# Patient Record
Sex: Female | Born: 1985 | Race: Black or African American | Hispanic: No | Marital: Single | Smoking: Current every day smoker
Health system: Southern US, Community
[De-identification: ages and names within clinical notes are randomized; demographics above are authoritative.]

## PROBLEM LIST (undated history)

## (undated) ENCOUNTER — Inpatient Hospital Stay: Admission: EM | Payer: Self-pay | Source: Home / Self Care

## (undated) DIAGNOSIS — D649 Anemia, unspecified: Secondary | ICD-10-CM

## (undated) DIAGNOSIS — F419 Anxiety disorder, unspecified: Secondary | ICD-10-CM

## (undated) DIAGNOSIS — E039 Hypothyroidism, unspecified: Secondary | ICD-10-CM

## (undated) DIAGNOSIS — J45909 Unspecified asthma, uncomplicated: Secondary | ICD-10-CM

## (undated) HISTORY — DX: Anemia, unspecified: D64.9

## (undated) HISTORY — DX: Unspecified asthma, uncomplicated: J45.909

## (undated) HISTORY — DX: Hypothyroidism, unspecified: E03.9

## (undated) HISTORY — DX: Anxiety disorder, unspecified: F41.9

## (undated) HISTORY — PX: TUBAL LIGATION: SHX77

---
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
## 2006-08-23 ENCOUNTER — Ambulatory Visit (HOSPITAL_COMMUNITY): Admission: RE | Admit: 2006-08-23 | Discharge: 2006-08-23 | Payer: Self-pay | Admitting: Obstetrics & Gynecology

## 2006-10-18 ENCOUNTER — Inpatient Hospital Stay (HOSPITAL_COMMUNITY): Admission: AD | Admit: 2006-10-18 | Discharge: 2006-10-19 | Payer: Self-pay | Admitting: Obstetrics & Gynecology

## 2006-10-18 ENCOUNTER — Ambulatory Visit: Payer: Self-pay | Admitting: Family Medicine

## 2007-08-15 ENCOUNTER — Emergency Department (HOSPITAL_COMMUNITY): Admission: EM | Admit: 2007-08-15 | Discharge: 2007-08-15 | Payer: Self-pay | Admitting: Emergency Medicine

## 2007-09-03 ENCOUNTER — Inpatient Hospital Stay (HOSPITAL_COMMUNITY): Admission: AD | Admit: 2007-09-03 | Discharge: 2007-09-04 | Payer: Self-pay | Admitting: Obstetrics and Gynecology

## 2008-01-11 ENCOUNTER — Ambulatory Visit: Payer: Self-pay | Admitting: Obstetrics and Gynecology

## 2008-01-11 ENCOUNTER — Inpatient Hospital Stay (HOSPITAL_COMMUNITY): Admission: AD | Admit: 2008-01-11 | Discharge: 2008-01-11 | Payer: Self-pay | Admitting: Obstetrics & Gynecology

## 2008-01-17 ENCOUNTER — Inpatient Hospital Stay (HOSPITAL_COMMUNITY): Admission: AD | Admit: 2008-01-17 | Discharge: 2008-01-18 | Payer: Self-pay | Admitting: Obstetrics and Gynecology

## 2008-01-17 ENCOUNTER — Ambulatory Visit: Payer: Self-pay | Admitting: Obstetrics and Gynecology

## 2008-10-24 ENCOUNTER — Inpatient Hospital Stay (HOSPITAL_COMMUNITY): Admission: AD | Admit: 2008-10-24 | Discharge: 2008-10-24 | Payer: Self-pay | Admitting: Obstetrics & Gynecology

## 2009-02-06 ENCOUNTER — Emergency Department (HOSPITAL_COMMUNITY): Admission: EM | Admit: 2009-02-06 | Discharge: 2009-02-06 | Payer: Self-pay | Admitting: Emergency Medicine

## 2009-08-14 ENCOUNTER — Inpatient Hospital Stay (HOSPITAL_COMMUNITY): Admission: AD | Admit: 2009-08-14 | Discharge: 2009-08-15 | Payer: Self-pay | Admitting: Obstetrics & Gynecology

## 2009-12-15 ENCOUNTER — Ambulatory Visit: Payer: Self-pay | Admitting: Family Medicine

## 2009-12-15 ENCOUNTER — Inpatient Hospital Stay (HOSPITAL_COMMUNITY): Admission: AD | Admit: 2009-12-15 | Discharge: 2009-12-17 | Payer: Self-pay | Admitting: Obstetrics & Gynecology

## 2010-07-15 LAB — CBC
HCT: 30.7 % — ABNORMAL LOW (ref 36.0–46.0)
HCT: 33.5 % — ABNORMAL LOW (ref 36.0–46.0)
Hemoglobin: 10.9 g/dL — ABNORMAL LOW (ref 12.0–15.0)
MCHC: 34.8 g/dL (ref 30.0–36.0)
MCV: 96.7 fL (ref 78.0–100.0)
Platelets: 354 10*3/uL (ref 150–400)
RBC: 3.18 MIL/uL — ABNORMAL LOW (ref 3.87–5.11)
RDW: 13.6 % (ref 11.5–15.5)

## 2010-07-15 LAB — RAPID URINE DRUG SCREEN, HOSP PERFORMED
Amphetamines: NOT DETECTED
Tetrahydrocannabinol: NOT DETECTED

## 2010-07-20 LAB — URINALYSIS, ROUTINE W REFLEX MICROSCOPIC
Glucose, UA: NEGATIVE mg/dL
Leukocytes, UA: NEGATIVE
Protein, ur: NEGATIVE mg/dL
Specific Gravity, Urine: 1.025 (ref 1.005–1.030)
pH: 7 (ref 5.0–8.0)

## 2010-07-20 LAB — CBC
HCT: 29.8 % — ABNORMAL LOW (ref 36.0–46.0)
Platelets: 327 10*3/uL (ref 150–400)
RDW: 13.3 % (ref 11.5–15.5)

## 2010-07-20 LAB — WET PREP, GENITAL: Yeast Wet Prep HPF POC: NONE SEEN

## 2010-07-20 LAB — URINE MICROSCOPIC-ADD ON

## 2010-07-20 LAB — POCT PREGNANCY, URINE: Preg Test, Ur: POSITIVE

## 2010-08-09 LAB — WET PREP, GENITAL

## 2010-08-09 LAB — URINALYSIS, ROUTINE W REFLEX MICROSCOPIC
Glucose, UA: NEGATIVE mg/dL
Ketones, ur: NEGATIVE mg/dL
Leukocytes, UA: NEGATIVE
pH: 5.5 (ref 5.0–8.0)

## 2010-08-09 LAB — GC/CHLAMYDIA PROBE AMP, GENITAL: Chlamydia, DNA Probe: NEGATIVE

## 2010-08-09 LAB — URINE MICROSCOPIC-ADD ON

## 2010-08-16 ENCOUNTER — Inpatient Hospital Stay (INDEPENDENT_AMBULATORY_CARE_PROVIDER_SITE_OTHER)
Admission: RE | Admit: 2010-08-16 | Discharge: 2010-08-16 | Disposition: A | Discharge status: Home / Self Care | Payer: Self-pay | Source: Ambulatory Visit | Attending: Family Medicine | Admitting: Family Medicine

## 2010-08-16 DIAGNOSIS — N912 Amenorrhea, unspecified: Secondary | ICD-10-CM

## 2010-08-16 LAB — POCT PREGNANCY, URINE: Preg Test, Ur: NEGATIVE

## 2011-01-25 LAB — POCT I-STAT, CHEM 8
Calcium, Ion: 1.27
HCT: 35 — ABNORMAL LOW
TCO2: 20

## 2011-01-25 LAB — D-DIMER, QUANTITATIVE: D-Dimer, Quant: 0.52 — ABNORMAL HIGH

## 2011-01-25 LAB — WET PREP, GENITAL: Yeast Wet Prep HPF POC: NONE SEEN

## 2011-01-25 LAB — POCT PREGNANCY, URINE: Operator id: 146091

## 2011-01-31 LAB — CBC
Hemoglobin: 9.9 — ABNORMAL LOW
MCHC: 34.3
MCV: 95.7
RBC: 3.03 — ABNORMAL LOW

## 2011-02-16 LAB — CBC
HCT: 33.3 — ABNORMAL LOW
MCV: 93.3
Platelets: 383
RDW: 14
WBC: 10.1

## 2011-02-16 LAB — RPR: RPR Ser Ql: NONREACTIVE

## 2013-03-15 ENCOUNTER — Encounter (HOSPITAL_COMMUNITY): Payer: Self-pay | Admitting: Emergency Medicine

## 2013-03-15 ENCOUNTER — Emergency Department (HOSPITAL_COMMUNITY)
Admission: EM | Admit: 2013-03-15 | Discharge: 2013-03-15 | Disposition: A | Discharge status: Home / Self Care | Payer: Self-pay | Attending: Emergency Medicine | Admitting: Emergency Medicine

## 2013-03-15 DIAGNOSIS — K029 Dental caries, unspecified: Secondary | ICD-10-CM | POA: Insufficient documentation

## 2013-03-15 DIAGNOSIS — F17200 Nicotine dependence, unspecified, uncomplicated: Secondary | ICD-10-CM | POA: Insufficient documentation

## 2013-03-15 DIAGNOSIS — K089 Disorder of teeth and supporting structures, unspecified: Secondary | ICD-10-CM | POA: Insufficient documentation

## 2013-03-15 DIAGNOSIS — K0889 Other specified disorders of teeth and supporting structures: Secondary | ICD-10-CM

## 2013-03-15 MED ORDER — HYDROCODONE-ACETAMINOPHEN 5-325 MG PO TABS: 1.0000 | ORAL_TABLET | ORAL | Status: DC | PRN

## 2013-03-15 NOTE — ED Provider Notes (Signed)
CSN: [id number]     Arrival date & time 03/15/13  1402 History  This chart was scribed for non-physician practitioner, Oletha Blend, working with Gerhard Munch, MD by Shari Heritage, ED Scribe. This patient was seen in room TR05C/TR05C and the patient's care was started at 3:23 PM.    Chief Complaint  Patient presents with  . Dental Pain    The history is provided by the patient. No language interpreter was used.   HPI Comments: Brooke Christian is a 27 y.o. female who presents to the Emergency Department complaining of constant, waxing and waning, left upper dental pain onset 1-2 weeks ago. She describes pain as throbbing with intermittent instances of sharpness. She states that at its worst pain is very severe. She does not have a regular dentist and she hasn't seen one for this issue. She has taken ibuprofen for pain without significant relief. She denies fever, sweats, chills, numbness or paresthesias of face.   History reviewed. No pertinent past medical history. Past Surgical History  Procedure Laterality Date  . Tubal ligation     History reviewed. No pertinent family history. History  Substance Use Topics  . Smoking status: Current Every Day Smoker  . Smokeless tobacco: Not on file  . Alcohol Use: No   OB History   Grav Para Term Preterm Abortions TAB SAB Ect Mult Living                 Review of Systems  Constitutional: Negative for fever.  HENT: Positive for dental problem.   All other systems reviewed and are negative.    Allergies  Review of patient's allergies indicates no known allergies.  Home Medications  No current outpatient prescriptions on file. Triage Vitals: BP 125/77  Pulse 80  Temp(Src) 98.5 F (36.9 C) (Oral)  Resp 16  Ht 5\' 8"  (1.727 m)  Wt 165 lb (74.844 kg)  BMI 25.09 kg/m2  SpO2 100%  LMP 03/01/2013  Physical Exam  Nursing note and vitals reviewed. Constitutional: She is oriented to person, place, and time. She appears  well-developed and well-nourished. No distress.  HENT:  Head: Normocephalic and atraumatic.  Mouth/Throat: Uvula is midline, oropharynx is clear and moist and mucous membranes are normal. No trismus in the jaw. Abnormal dentition. Dental caries present. No dental abscesses or uvula swelling. No oropharyngeal exudate, posterior oropharyngeal edema, posterior oropharyngeal erythema or tonsillar abscesses.  Teeth largely in poor dentition, left upper and lower molars broken with large cavities, surrounding gingiva normal in appearance, handling secretions appropriately, no trismus  Eyes: Conjunctivae and EOM are normal. Pupils are equal, round, and reactive to light.  Neck: Normal range of motion. Neck supple.  Cardiovascular: Normal rate, regular rhythm and normal heart sounds.   Pulmonary/Chest: Effort normal and breath sounds normal. No respiratory distress. She has no wheezes.  Musculoskeletal: Normal range of motion.  Neurological: She is alert and oriented to person, place, and time.  Skin: Skin is warm and dry. She is not diaphoretic.  Psychiatric: She has a normal mood and affect.    ED Course  Procedures (including critical care time) DIAGNOSTIC STUDIES: Oxygen Saturation is 100% on room air, normal by my interpretation.    COORDINATION OF CARE: 3:26 PM- Patient informed of current plan for treatment and evaluation and agrees with plan at this time.   Labs Review Labs Reviewed - No data to display Imaging Review No results found.  EKG Interpretation   None  MDM   1. Pain, dental     Dental pain without signs of dental abscess.  Rx vicodin.  FU with dentist-referrals given.  Discussed plan with pt, she agreed.  Return precautions advised.  I personally performed the services described in this documentation, which was scribed in my presence. The recorded information has been reviewed and is accurate.  Garlon Hatchet, PA-C 03/15/13 [phone]

## 2013-03-15 NOTE — ED Notes (Signed)
Pt reports Left upper tooth pain x1-2 weks, pt reports taking OTC Advil

## 2013-03-15 NOTE — ED Provider Notes (Signed)
  Medical screening examination/treatment/procedure(s) were performed by non-physician practitioner and as supervising physician I was immediately available for consultation/collaboration.  EKG Interpretation   None          Angele Wiemann, MD 03/15/13 2358 

## 2013-08-09 ENCOUNTER — Emergency Department (HOSPITAL_COMMUNITY)
Admission: EM | Admit: 2013-08-09 | Discharge: 2013-08-09 | Disposition: A | Discharge status: Home / Self Care | Payer: Self-pay | Attending: Emergency Medicine | Admitting: Emergency Medicine

## 2013-08-09 ENCOUNTER — Encounter (HOSPITAL_COMMUNITY): Payer: Self-pay | Admitting: Emergency Medicine

## 2013-08-09 DIAGNOSIS — F17200 Nicotine dependence, unspecified, uncomplicated: Secondary | ICD-10-CM | POA: Insufficient documentation

## 2013-08-09 DIAGNOSIS — K089 Disorder of teeth and supporting structures, unspecified: Secondary | ICD-10-CM | POA: Insufficient documentation

## 2013-08-09 DIAGNOSIS — K0889 Other specified disorders of teeth and supporting structures: Secondary | ICD-10-CM

## 2013-08-09 DIAGNOSIS — K029 Dental caries, unspecified: Secondary | ICD-10-CM | POA: Insufficient documentation

## 2013-08-09 MED ORDER — ACETAMINOPHEN-CODEINE #3 300-30 MG PO TABS
1.0000 | ORAL_TABLET | Freq: Four times a day (QID) | ORAL | Status: DC | PRN
Start: 2013-08-09 — End: 2013-12-20

## 2013-08-09 MED ORDER — LIDOCAINE VISCOUS 2 % MT SOLN
15.0000 mL | Freq: Once | OROMUCOSAL | Status: AC
  Administered 2013-08-09: 15 mL via OROMUCOSAL
  Filled 2013-08-09: qty 15

## 2013-08-09 MED ORDER — ACETAMINOPHEN-CODEINE #3 300-30 MG PO TABS
1.0000 | ORAL_TABLET | Freq: Once | ORAL | Status: AC
  Administered 2013-08-09: 1 via ORAL
  Filled 2013-08-09: qty 1

## 2013-08-09 MED ORDER — LIDOCAINE VISCOUS 2 % MT SOLN: 15.0000 mL | Freq: Four times a day (QID) | OROMUCOSAL | Status: DC | PRN

## 2013-08-09 MED ORDER — AMOXICILLIN 500 MG PO CAPS: 500.0000 mg | ORAL_CAPSULE | Freq: Three times a day (TID) | ORAL | Status: DC

## 2013-08-09 NOTE — ED Provider Notes (Signed)
Medical screening examination/treatment/procedure(s) were performed by non-physician practitioner and as supervising physician I was immediately available for consultation/collaboration.   EKG Interpretation None        Gwyneth SproutWhitney Katheren Jimmerson, MD 08/09/13 1524

## 2013-08-09 NOTE — ED Provider Notes (Signed)
CSN: [id number]     Arrival date & time 08/09/13  1353 History   First MD Initiated Contact with Patient 08/09/13 1401    This chart was scribed for Francee Piccolo PA-C, a non-physician practitioner working with Gwyneth Sprout, MD by Lewanda Rife, ED Scribe. This patient was seen in room TR10C/TR10C and the patient's care was started at 2:46 PM     Chief Complaint  Patient presents with  . Dental Pain     (Consider location/radiation/quality/duration/timing/severity/associated sxs/prior Treatment) The history is provided by the patient. No language interpreter was used.   HPI Comments: Brooke Christian is a 28 y.o. female who presents to the Emergency Department complaining of constant left lower dental pain onset 1 week. Denies any precipitating factors. Describes pain as throbbing and gradually worsening in severity. Reports trying goody powders, salt water, and ibuprofen with no relief of symptoms. Reports pain is exacerbated with eating. Denies associated fever, drainage, and dysphagia. History reviewed. No pertinent past medical history. Past Surgical History  Procedure Laterality Date  . Tubal ligation     No family history on file. History  Substance Use Topics  . Smoking status: Current Every Day Smoker  . Smokeless tobacco: Not on file  . Alcohol Use: No   OB History   Grav Para Term Preterm Abortions TAB SAB Ect Mult Living                 Review of Systems  Constitutional: Negative for fever.  HENT: Positive for dental problem.   Psychiatric/Behavioral: Negative for confusion.      Allergies  Review of patient's allergies indicates no known allergies.  Home Medications   Current Outpatient Rx  Name  Route  Sig  Dispense  Refill  . acetaminophen (TYLENOL) 325 MG tablet   Oral   Take 325-650 mg by mouth every 6 (six) hours as needed for mild pain, moderate pain, fever or headache.         . Aspirin-Acetaminophen-Caffeine (GOODY HEADACHE  PO)   Oral   Take 1 packet by mouth 2 (two) times daily as needed (body ache, headache).         Marland Kitchen acetaminophen-codeine (TYLENOL #3) 300-30 MG per tablet   Oral   Take 1-2 tablets by mouth every 6 (six) hours as needed for moderate pain.   15 tablet   0   . amoxicillin (AMOXIL) 500 MG capsule   Oral   Take 1 capsule (500 mg total) by mouth 3 (three) times daily.   21 capsule   0   . lidocaine (XYLOCAINE) 2 % solution   Mouth/Throat   Use as directed 15 mLs in the mouth or throat every 6 (six) hours as needed for mouth pain.   100 mL   0    BP 152/90  Pulse 79  Temp(Src) 98.9 F (37.2 C) (Oral)  SpO2 100%  LMP 08/02/2013 Physical Exam  Nursing note and vitals reviewed. Constitutional: She is oriented to person, place, and time. She appears well-developed and well-nourished. No distress.  HENT:  Head: Normocephalic and atraumatic.  Right Ear: External ear normal.  Left Ear: External ear normal.  Nose: Nose normal.  Mouth/Throat: Uvula is midline, oropharynx is clear and moist and mucous membranes are normal. No trismus in the jaw. Abnormal dentition. Dental caries present. No dental abscesses or uvula swelling.    No signs of peritonsillar or tonsillar abscess. No signs of gingival abscess. Oropharynx is clear and without exudates.  Uvula  is midline.  Airway is intact. No signs of Ludwig's angina with palpation of the oral and sublingual mucosa.  Fractured tooth noted.        Eyes: Conjunctivae and EOM are normal.  Neck: Normal range of motion. Neck supple. No tracheal deviation present.  Cardiovascular: Normal rate.   Pulmonary/Chest: Effort normal. No respiratory distress.  Musculoskeletal: Normal range of motion.  Neurological: She is alert and oriented to person, place, and time.  Skin: Skin is warm and dry. She is not diaphoretic.  Psychiatric: She has a normal mood and affect. Her behavior is normal.    ED Course  Procedures (including critical care  time) COORDINATION OF CARE:  Nursing notes reviewed. Vital signs reviewed. Initial pt interview and examination performed.   Filed Vitals:   08/09/13 1406  BP: 152/90  Pulse: 79  Temp: 98.9 F (37.2 C)  TempSrc: Oral  SpO2: 100%    2:46 PM-Discussed work up plan with pt at bedside, which includes No orders of the defined types were placed in this encounter.  . Pt agrees with plan.   Treatment plan initiated: Medications  lidocaine (XYLOCAINE) 2 % viscous mouth solution 15 mL (not administered)  acetaminophen-codeine (TYLENOL #3) 300-30 MG per tablet 1 tablet (not administered)     Initial diagnostic testing ordered.       Labs Review Labs Reviewed - No data to display Imaging Review No results found.   EKG Interpretation None      MDM   Final diagnoses:  Pain, dental    Filed Vitals:   08/09/13 1406  BP: 152/90  Pulse: 79  Temp: 98.9 F (37.2 C)   Afebrile, NAD, non-toxic appearing, AAOx4.  Patient with toothache.  No gross abscess.  Exam unconcerning for Ludwig's angina or spread of infection.  Will treat with amoxicillin and pain medicine.  Urged patient to follow-up with dentist. Return precautions discussed. Patient is agreeable to plan. Patient is stable at time of discharge      I personally performed the services described in this documentation, which was scribed in my presence. The recorded information has been reviewed and is accurate.      Jeannetta EllisJennifer L Lylah Lantis, PA-C 08/09/13 1454

## 2013-08-09 NOTE — ED Notes (Signed)
Left lower dental pain x 1 week. Pt tearful.

## 2013-08-09 NOTE — Discharge Instructions (Signed)
Please follow up with your primary care physician in 1-2 days. If you do not have one please call the [facility] number listed above. Please follow up with your dentist, Dr. Jeanice Limurham, to schedule a follow up appointment.  Please take your antibiotic until completion. Please take pain medication and/or muscle relaxants as prescribed and as needed for pain. Please do not drive on narcotic pain medication or on muscle relaxants. Please read all discharge instructions and return precautions.    Dental Pain A tooth ache may be caused by cavities (tooth decay). Cavities expose the nerve of the tooth to air and hot or cold temperatures. It may come from an infection or abscess (also called a boil or furuncle) around your tooth. It is also often caused by dental caries (tooth decay). This causes the pain you are having. DIAGNOSIS  Your caregiver can diagnose this problem by exam. TREATMENT   If caused by an infection, it may be treated with medications which kill germs (antibiotics) and pain medications as prescribed by your caregiver. Take medications as directed.  Only take over-the-counter or prescription medicines for pain, discomfort, or fever as directed by your caregiver.  Whether the tooth ache today is caused by infection or dental disease, you should see your dentist as soon as possible for further care. SEEK MEDICAL CARE IF: The exam and treatment you received today has been provided on an emergency basis only. This is not a substitute for complete medical or dental care. If your problem worsens or new problems (symptoms) appear, and you are unable to meet with your dentist, call or return to this location. SEEK IMMEDIATE MEDICAL CARE IF:   You have a fever.  You develop redness and swelling of your face, jaw, or neck.  You are unable to open your mouth.  You have severe pain uncontrolled by pain medicine. MAKE SURE YOU:   Understand these instructions.  Will watch  your condition.  Will get help right away if you are not doing well or get worse. Document Released: 04/18/2005 Document Revised: 07/11/2011 Document Reviewed: 12/05/2007 [facility] Patient Information 2014 [employer] [place]  RESOURCE GUIDE  If you do not have a primary care doctor to follow up with regarding today's visit, please call the [facility] at [phone] to make an appointment. Hours of operation are 10am - 7pm, Monday through Friday, and they have a sliding scale fee.    Dental Assistance Please contact the on-call dentist listed on your discharge papers WITHIN 48 HOURS.  If the on-call dentist agrees that your condition is emergent, there is a CHANCE (not a guarantee) that you MAY receive a savings on your visit at this point. If you wait more than 48 hours, it will not be considered an emergency.   Drs. Moreen Fowleravid and Janna Civils:  [address], [place], [place], [address], [phone]  Short-notice availability  Tooth evaluation: $100  Emergency Treatment: $200 (including exam, xrays, tooth extraction, and post-op visit)  Patients with Medicaid: [facility] [phone], [phone] [address], [phone]  If unable to pay, or uninsured, contact [facility] [phone] in [place], [phone] in [place] [facility] [facility]) to become qualified for the adult dental clinic  Other Low-Cost Community Dental Services: - [facility]: [address], [place], [place], [address], [phone], [phone], 2nd and 4th Thursday of the month at 6:30am.  10 clients each day by appointment, can sometimes see walk-in patients  if someone does not show for an appointment. [facility]:  [address], [place], [place], [address], [phone] [facility]:  [address], [place], [place], [address], [phone] [facility]:  [phone] [facility]:   [phone] [facility]:  [phone]

## 2013-08-12 ENCOUNTER — Telehealth (HOSPITAL_BASED_OUTPATIENT_CLINIC_OR_DEPARTMENT_OTHER): Payer: Self-pay

## 2013-08-12 NOTE — Telephone Encounter (Signed)
Pt given incorrect dental referral.  Referral corrected to Dr Lucky CowboyKnox and faxed to Dr Lacretia LeighKnox's office.

## 2013-08-13 ENCOUNTER — Telehealth (HOSPITAL_BASED_OUTPATIENT_CLINIC_OR_DEPARTMENT_OTHER): Payer: Self-pay

## 2013-08-13 NOTE — Telephone Encounter (Signed)
ED referral refaxed to Dr Lucky CowboyKnox office [phone] per request from office.

## 2013-12-19 ENCOUNTER — Encounter (HOSPITAL_COMMUNITY): Payer: Self-pay | Admitting: Emergency Medicine

## 2013-12-19 DIAGNOSIS — N898 Other specified noninflammatory disorders of vagina: Secondary | ICD-10-CM | POA: Insufficient documentation

## 2013-12-19 DIAGNOSIS — Z3202 Encounter for pregnancy test, result negative: Secondary | ICD-10-CM | POA: Insufficient documentation

## 2013-12-19 DIAGNOSIS — F17200 Nicotine dependence, unspecified, uncomplicated: Secondary | ICD-10-CM | POA: Insufficient documentation

## 2013-12-19 DIAGNOSIS — A5901 Trichomonal vulvovaginitis: Secondary | ICD-10-CM | POA: Insufficient documentation

## 2013-12-19 LAB — POC URINE PREG, ED: Preg Test, Ur: NEGATIVE

## 2013-12-19 NOTE — ED Notes (Signed)
Pt presents with white vaginal discharge with itching for the past week, also reports lower abdominal cramping.  Denies urinary symptoms.  Pt admits to having unprotected sex with someone who was recently treated for an STD.

## 2013-12-20 ENCOUNTER — Emergency Department (HOSPITAL_COMMUNITY)
Admission: EM | Admit: 2013-12-20 | Discharge: 2013-12-20 | Disposition: A | Discharge status: Home / Self Care | Payer: Self-pay | Attending: Emergency Medicine | Admitting: Emergency Medicine

## 2013-12-20 DIAGNOSIS — A5901 Trichomonal vulvovaginitis: Secondary | ICD-10-CM

## 2013-12-20 LAB — WET PREP, GENITAL: YEAST WET PREP: NONE SEEN

## 2013-12-20 LAB — URINALYSIS, ROUTINE W REFLEX MICROSCOPIC
BILIRUBIN URINE: NEGATIVE
Glucose, UA: NEGATIVE mg/dL
Ketones, ur: NEGATIVE mg/dL
NITRITE: NEGATIVE
PH: 6.5 (ref 5.0–8.0)
Protein, ur: NEGATIVE mg/dL
SPECIFIC GRAVITY, URINE: 1.005 (ref 1.005–1.030)
Urobilinogen, UA: 0.2 mg/dL (ref 0.0–1.0)

## 2013-12-20 LAB — URINE MICROSCOPIC-ADD ON

## 2013-12-20 LAB — RPR

## 2013-12-20 LAB — HIV ANTIBODY (ROUTINE TESTING W REFLEX): HIV 1&2 Ab, 4th Generation: NONREACTIVE

## 2013-12-20 MED ORDER — METRONIDAZOLE 500 MG PO TABS: 500.0000 mg | ORAL_TABLET | Freq: Two times a day (BID) | ORAL | Status: DC

## 2013-12-20 MED ORDER — AZITHROMYCIN 1 G PO PACK
1.0000 g | PACK | Freq: Once | ORAL | Status: AC
Start: 2013-12-20 — End: 2013-12-20
  Administered 2013-12-20: 1 g via ORAL
  Filled 2013-12-20: qty 1

## 2013-12-20 MED ORDER — LIDOCAINE HCL (PF) 1 % IJ SOLN
INTRAMUSCULAR | Status: AC
  Administered 2013-12-20: 1 mL
  Filled 2013-12-20: qty 5

## 2013-12-20 MED ORDER — CEFTRIAXONE SODIUM 250 MG IJ SOLR
250.0000 mg | Freq: Once | INTRAMUSCULAR | Status: AC
  Administered 2013-12-20: 250 mg via INTRAMUSCULAR
  Filled 2013-12-20: qty 250

## 2013-12-20 NOTE — ED Provider Notes (Signed)
CSN: [id number]     Arrival date & time 12/19/13  2324 History   First MD Initiated Contact with Patient 12/20/13 0154     Chief Complaint  Patient presents with  . Vaginal Discharge     (Consider location/radiation/quality/duration/timing/severity/associated sxs/prior Treatment) HPI  Brooke Christian is a 28 y.o. female who presents for evaluation of vaginal discharge. Discharge. Started one week ago after unprotected sex, 2 weeks ago. Last menstrual period 12/11/2013. She denies fever, chills, nausea, vomiting, weakness, or dizziness. There are no other known modifying factors.   History reviewed. No pertinent past medical history. Past Surgical History  Procedure Laterality Date  . Tubal ligation     No family history on file. History  Substance Use Topics  . Smoking status: Current Every Day Smoker  . Smokeless tobacco: Not on file  . Alcohol Use: No   OB History   Grav Para Term Preterm Abortions TAB SAB Ect Mult Living                 Review of Systems  All other systems reviewed and are negative.     Allergies  Review of patient's allergies indicates no known allergies.  Home Medications   Prior to Admission medications   Medication Sig Start Date End Date Taking? Authorizing Provider  metroNIDAZOLE (FLAGYL) 500 MG tablet Take 1 tablet (500 mg total) by mouth 2 (two) times daily. One po bid x 7 days 12/20/13   Flint MelterElliott L Rojelio Uhrich, MD   BP 120/78  Pulse 69  Temp(Src) 98.1 F (36.7 C) (Oral)  Resp 19  Ht 5\' 8"  (1.727 m)  Wt 166 lb (75.297 kg)  BMI 25.25 kg/m2  SpO2 99%  LMP 12/11/2013 Physical Exam  Nursing note and vitals reviewed. Constitutional: She is oriented to person, place, and time. She appears well-developed and well-nourished.  HENT:  Head: Normocephalic and atraumatic.  Eyes: Conjunctivae and EOM are normal. Pupils are equal, round, and reactive to light.  Neck: Normal range of motion and phonation normal. Neck supple.  Cardiovascular:  Normal rate, regular rhythm and intact distal pulses.   Pulmonary/Chest: Effort normal and breath sounds normal. She exhibits no tenderness.  Abdominal: Soft. She exhibits no distension. There is no tenderness. There is no guarding.  Genitourinary:  Normal external female genitalia. Small amount of white, frothy vaginal discharge. There is no cervical motion tenderness adnexal mass or pelvic tenderness on bimanual examination  Musculoskeletal: Normal range of motion.  Neurological: She is alert and oriented to person, place, and time. She exhibits normal muscle tone.  Skin: Skin is warm and dry.  Psychiatric: She has a normal mood and affect. Her behavior is normal. Judgment and thought content normal.    ED Course  Procedures (including critical care time)  Medications  cefTRIAXone (ROCEPHIN) injection 250 mg (250 mg Intramuscular Given 12/20/13 0259)  azithromycin (ZITHROMAX) powder 1 g (1 g Oral Given 12/20/13 0303)  lidocaine (PF) (XYLOCAINE) 1 % injection (1 mL  Given 12/20/13 0303)    Patient Vitals for the past 24 hrs:  BP Temp Temp src Pulse Resp SpO2 Height Weight  12/20/13 0402 120/78 mmHg - - 69 19 99 % - -  12/20/13 0402 - 98.1 F (36.7 C) Oral - - - - -  12/20/13 0330 123/79 mmHg 97.8 F (36.6 C) Oral 68 20 100 % - -  12/19/13 2329 161/82 mmHg 98.3 F (36.8 C) Oral 100 18 100 % 5\' 8"  (1.727 m) 166 lb (75.297 kg)  Labs Review Labs Reviewed  WET PREP, GENITAL - Abnormal; Notable for the following:    Trich, Wet Prep MANY (*)    Clue Cells Wet Prep HPF POC MANY (*)    WBC, Wet Prep HPF POC MANY (*)    All other components within normal limits  URINALYSIS, ROUTINE W REFLEX MICROSCOPIC - Abnormal; Notable for the following:    APPearance CLOUDY (*)    Hgb urine dipstick SMALL (*)    Leukocytes, UA LARGE (*)    All other components within normal limits  URINE MICROSCOPIC-ADD ON - Abnormal; Notable for the following:    Squamous Epithelial / LPF MANY (*)     Bacteria, UA FEW (*)    All other components within normal limits  GC/CHLAMYDIA PROBE AMP  RPR  HIV ANTIBODY (ROUTINE TESTING)  POC URINE PREG, ED    Imaging Review No results found.   EKG Interpretation None      MDM   Final diagnoses:  Trichomonas vaginitis    Evaluation, consistent with Trichomonas infection. She is at risk for additional diseases such as gonorrhea and Chlamydia, and was, therefore treated for same, as well.  Nursing Notes Reviewed/ Care Coordinated Applicable Imaging Reviewed Interpretation of Laboratory Data incorporated into ED treatment  The patient appears reasonably screened and/or stabilized for discharge and I doubt any other medical condition or other [employer] requiring further screening, evaluation, or treatment in the ED at this time prior to discharge.  Plan: Home Medications- Flagyl; Home Treatments- no sex, and her partners treated ,rest; return here if the recommended treatment, does not improve the symptoms; Recommended follow up- PCP, when necessary    Flint Melter, MD 12/20/13 [phone]

## 2013-12-20 NOTE — ED Notes (Signed)
Pt. Reports white vaginal discharge after having unprotected sex x2 weeks ago. States partner was tested and told her he did not have anything but still taking antibiotics anyway. Pt. Requesting STD check

## 2013-12-20 NOTE — Discharge Instructions (Signed)
Trichomoniasis °Trichomoniasis is an infection caused by an organism called Trichomonas. The infection can affect both women and men. In women, the outer female genitalia and the vagina are affected. In men, the penis is mainly affected, but the prostate and other reproductive organs can also be involved. Trichomoniasis is a sexually transmitted infection (STI) and is most often passed to another person through sexual contact.  °RISK FACTORS °· Having unprotected sexual intercourse. °· Having sexual intercourse with an infected partner. °SIGNS AND SYMPTOMS  °Symptoms of trichomoniasis in women include: °· Abnormal gray-green frothy vaginal discharge. °· Itching and irritation of the vagina. °· Itching and irritation of the area outside the vagina. °Symptoms of trichomoniasis in men include:  °· Penile discharge with or without pain. °· Pain during urination. This results from inflammation of the urethra. °DIAGNOSIS  °Trichomoniasis may be found during a Pap test or physical exam. Your health care provider may use one of the following methods to help diagnose this infection: °· Examining vaginal discharge under a microscope. For men, urethral discharge would be examined. °· Testing the pH of the vagina with a test tape. °· Using a vaginal swab test that checks for the Trichomonas organism. A test is available that provides results within a few minutes. °· Doing a culture test for the organism. This is not usually needed. °TREATMENT  °· You may be given medicine to fight the infection. Women should inform their health care provider if they could be or are pregnant. Some medicines used to treat the infection should not be taken during pregnancy. °· Your health care provider may recommend over-the-counter medicines or creams to decrease itching or irritation. °· Your sexual partner will need to be treated if infected. °HOME CARE INSTRUCTIONS  °· Take medicines only as directed by your health care provider. °· Take  over-the-counter medicine for itching or irritation as directed by your health care provider. °· Do not have sexual intercourse while you have the infection. °· Women should not douche or wear tampons while they have the infection. °· Discuss your infection with your partner. Your partner may have gotten the infection from you, or you may have gotten it from your partner. °· Have your sex partner get examined and treated if necessary. °· Practice safe, informed, and protected sex. °· See your health care provider for other STI testing. °SEEK MEDICAL CARE IF:  °· You still have symptoms after you finish your medicine. °· You develop abdominal pain. °· You have pain when you urinate. °· You have bleeding after sexual intercourse. °· You develop a rash. °· Your medicine makes you sick or makes you throw up (vomit). °MAKE SURE YOU: °· Understand these instructions. °· Will watch your condition. °· Will get help right away if you are not doing well or get worse. °Document Released: 10/12/2000 Document Revised: 09/02/2013 Document Reviewed: 01/28/2013 °ExitCare® Patient Information ©2015 [employer]. This information is not intended to replace advice given to you by your health care provider. Make sure you discuss any questions you have with your health care provider. ° °Sexually Transmitted Disease °A sexually transmitted disease (STD) is a disease or infection that may be passed (transmitted) from person to person, usually during sexual activity. This may happen by way of saliva, semen, blood, vaginal mucus, or urine. Common STDs include:  °· Gonorrhea.   °· Chlamydia.   °· Syphilis.   °· HIV and AIDS.   °· Genital herpes.   °· Hepatitis B and C.   °· Trichomonas.   °· Human papillomavirus (  HPV).   °· Pubic lice.   °· Scabies. °· Mites. °· Bacterial vaginosis. °WHAT ARE CAUSES OF STDs? °An STD may be caused by bacteria, a virus, or parasites. STDs are often transmitted during sexual activity if one person is infected.  However, they may also be transmitted through nonsexual means. STDs may be transmitted after:  °· Sexual intercourse with an infected person.   °· Sharing sex toys with an infected person.   °· Sharing needles with an infected person or using unclean piercing or tattoo needles. °· Having intimate contact with the genitals, mouth, or rectal areas of an infected person.   °· Exposure to infected fluids during birth. °WHAT ARE THE SIGNS AND SYMPTOMS OF STDs? °Different STDs have different symptoms. Some people may not have any symptoms. If symptoms are present, they may include:  °· Painful or bloody urination.   °· Pain in the pelvis, abdomen, vagina, anus, throat, or eyes.   °· A skin rash, itching, or irritation. °· Growths, ulcerations, blisters, or sores in the genital and anal areas. °· Abnormal vaginal discharge with or without bad odor.   °· Penile discharge in men.   °· Fever.   °· Pain or bleeding during sexual intercourse.   °· Swollen glands in the groin area.   °· Yellow skin and eyes (jaundice). This is seen with hepatitis.   °· Swollen testicles. °· Infertility. °· Sores and blisters in the mouth. °HOW ARE STDs DIAGNOSED? °To make a diagnosis, your health care provider may:  °· Take a medical history.   °· Perform a physical exam.   °· Take a sample of any discharge to examine. °· Swab the throat, cervix, opening to the penis, rectum, or vagina for testing. °· Test a sample of your first morning urine.   °· Perform blood tests.   °· Perform a Pap test, if this applies.   °· Perform a colposcopy.   °· Perform a laparoscopy.   °HOW ARE STDs TREATED? ° Treatment depends on the STD. Some STDs may be treated but not cured.  °· Chlamydia, gonorrhea, trichomonas, and syphilis can be cured with antibiotic medicine.   °· Genital herpes, hepatitis, and HIV can be treated, but not cured, with prescribed medicines. The medicines lessen symptoms.   °· Genital warts from HPV can be treated with medicine or by  freezing, burning (electrocautery), or surgery. Warts may come back.   °· HPV cannot be cured with medicine or surgery. However, abnormal areas may be removed from the cervix, vagina, or vulva.   °· If your diagnosis is confirmed, your recent sexual partners need treatment. This is true even if they are symptom-free or have a negative culture or evaluation. They should not have sex until their health care providers say it is okay. °HOW CAN I REDUCE MY RISK OF GETTING AN STD? °Take these steps to reduce your risk of getting an STD: °· Use latex condoms, dental dams, and water-soluble lubricants during sexual activity. Do not use petroleum jelly or oils. °· Avoid having multiple sex partners. °· Do not have sex with someone who has other sex partners. °· Do not have sex with anyone you do not know or who is at high risk for an STD. °· Avoid risky sex practices that can break your skin. °· Do not have sex if you have open sores on your mouth or skin. °· Avoid drinking too much alcohol or taking illegal drugs. Alcohol and drugs can affect your judgment and put you in a vulnerable position. °· Avoid engaging in oral and anal sex acts. °· Get vaccinated for HPV and hepatitis. If you   have not received these vaccines in the past, talk to your health care provider about whether one or both might be right for you.   °· If you are at risk of being infected with HIV, it is recommended that you take a prescription medicine daily to prevent HIV infection. This is called pre-exposure prophylaxis (PrEP). You are considered at risk if: °¨ You are a man who has sex with other men (MSM). °¨ You are a heterosexual man or woman and are sexually active with more than one partner. °¨ You take drugs by injection. °¨ You are sexually active with a partner who has HIV. °· Talk with your health care provider about whether you are at high risk of being infected with HIV. If you choose to begin PrEP, you should first be tested for HIV. You  should then be tested every 3 months for as long as you are taking PrEP.   °WHAT SHOULD I DO IF I THINK I HAVE AN STD? °· See your health care provider.   °· Tell your sexual partner(s). They should be tested and treated for any STDs. °· Do not have sex until your health care provider says it is okay.  °WHEN SHOULD I GET IMMEDIATE MEDICAL CARE? °Contact your health care provider right away if:  °· You have severe abdominal pain. °· You are a man and notice swelling or pain in your testicles. °· You are a woman and notice swelling or pain in your vagina. °Document Released: 07/09/2002 Document Revised: 04/23/2013 Document Reviewed: 11/06/2012 °ExitCare® Patient Information ©2015 [employer]. This information is not intended to replace advice given to you by your health care provider. Make sure you discuss any questions you have with your health care provider. ° °

## 2013-12-21 LAB — GC/CHLAMYDIA PROBE AMP
CT Probe RNA: NEGATIVE
GC Probe RNA: NEGATIVE

## 2014-02-01 ENCOUNTER — Emergency Department (HOSPITAL_COMMUNITY)
Admission: EM | Admit: 2014-02-01 | Discharge: 2014-02-01 | Disposition: A | Discharge status: Home / Self Care | Payer: Self-pay | Attending: Emergency Medicine | Admitting: Emergency Medicine

## 2014-02-01 ENCOUNTER — Encounter (HOSPITAL_COMMUNITY): Payer: Self-pay | Admitting: Emergency Medicine

## 2014-02-01 DIAGNOSIS — B373 Candidiasis of vulva and vagina: Secondary | ICD-10-CM | POA: Insufficient documentation

## 2014-02-01 DIAGNOSIS — R300 Dysuria: Secondary | ICD-10-CM | POA: Insufficient documentation

## 2014-02-01 DIAGNOSIS — Z3202 Encounter for pregnancy test, result negative: Secondary | ICD-10-CM | POA: Insufficient documentation

## 2014-02-01 DIAGNOSIS — Z720 Tobacco use: Secondary | ICD-10-CM | POA: Insufficient documentation

## 2014-02-01 DIAGNOSIS — B3731 Acute candidiasis of vulva and vagina: Secondary | ICD-10-CM

## 2014-02-01 LAB — URINALYSIS, ROUTINE W REFLEX MICROSCOPIC
Bilirubin Urine: NEGATIVE
Glucose, UA: NEGATIVE mg/dL
KETONES UR: NEGATIVE mg/dL
LEUKOCYTES UA: NEGATIVE
NITRITE: NEGATIVE
PH: 6 (ref 5.0–8.0)
PROTEIN: NEGATIVE mg/dL
Specific Gravity, Urine: 1.011 (ref 1.005–1.030)
UROBILINOGEN UA: 0.2 mg/dL (ref 0.0–1.0)

## 2014-02-01 LAB — WET PREP, GENITAL
CLUE CELLS WET PREP: NONE SEEN
TRICH WET PREP: NONE SEEN

## 2014-02-01 LAB — URINE MICROSCOPIC-ADD ON

## 2014-02-01 LAB — POC URINE PREG, ED: Preg Test, Ur: NEGATIVE

## 2014-02-01 MED ORDER — FLUCONAZOLE 100 MG PO TABS
150.0000 mg | ORAL_TABLET | Freq: Once | ORAL | Status: AC
  Administered 2014-02-01: 150 mg via ORAL
  Filled 2014-02-01: qty 2

## 2014-02-01 NOTE — ED Provider Notes (Signed)
CSN: [id number]     Arrival date & time 02/01/14  1616 History   First MD Initiated Contact with Patient 02/01/14 1705     Chief Complaint  Patient presents with  . Vaginal Discharge    Patient is a 28 y.o. female presenting with vaginal discharge. The history is provided by the patient.  Vaginal Discharge Quality:  Thick and white Onset quality:  Gradual Duration:  3 days Timing:  Constant Progression:  Worsening Chronicity:  New Relieved by:  Nothing Worsened by:  Nothing tried Ineffective treatments:  None tried Associated symptoms: dysuria   Associated symptoms: no abdominal pain, no fever, no nausea and no vomiting   Risk factors: STI    Pt is a 28 yo F with a PMH of tubal ligation, trich and BV who presents with vaginal discharge x 3 days. Patient also reports a history of Chlamydia in the past. Patient essentially active with one female partner and reports that he has had no symptoms or 6 her transmitted infections recently. Patient reports dysuria but no hematuria. She also states that it burns when she wipes her bottom after using the restroom. She denies fever, emesis, abdominal pain, diarrhea.  History reviewed. No pertinent past medical history. Past Surgical History  Procedure Laterality Date  . Tubal ligation     No family history on file. History  Substance Use Topics  . Smoking status: Current Every Day Smoker    Types: Cigarettes  . Smokeless tobacco: Not on file  . Alcohol Use: No   OB History   Grav Para Term Preterm Abortions TAB SAB Ect Mult Living                 Review of Systems  Constitutional: Negative for fever.  HENT: Negative for rhinorrhea and sore throat.   Eyes: Negative for visual disturbance.  Respiratory: Negative for chest tightness and shortness of breath.   Cardiovascular: Negative for chest pain and palpitations.  Gastrointestinal: Negative for nausea, vomiting, abdominal pain and constipation.  Genitourinary: Positive for dysuria  and vaginal discharge. Negative for hematuria, flank pain and pelvic pain.  Musculoskeletal: Negative for back pain and neck pain.  Skin: Negative for rash.  Neurological: Negative for dizziness and headaches.  Psychiatric/Behavioral: Negative for confusion.  All other systems reviewed and are negative.  Allergies  Review of patient's allergies indicates no known allergies.  Home Medications   Prior to Admission medications   Not on File   BP 148/90  Pulse 78  Temp(Src) 98.4 F (36.9 C) (Oral)  Resp 16  SpO2 100% Physical Exam  Constitutional: She is oriented to person, place, and time. She appears well-developed and well-nourished. No distress.  HENT:  Head: Normocephalic and atraumatic.  Mouth/Throat: Oropharynx is clear and moist.  Eyes: EOM are normal. Pupils are equal, round, and reactive to light.  Neck: Neck supple. No JVD present.  Cardiovascular: Normal rate, regular rhythm, normal heart sounds and intact distal pulses.  Exam reveals no gallop.   No murmur heard. Pulmonary/Chest: Effort normal and breath sounds normal. She has no wheezes. She has no rales.  Abdominal: Soft. She exhibits no distension. There is no tenderness.  Genitourinary: Cervix exhibits discharge. Cervix exhibits no motion tenderness. Right adnexum displays no mass and no tenderness. Left adnexum displays no mass and no tenderness.  Musculoskeletal: Normal range of motion. She exhibits no tenderness.  Neurological: She is alert and oriented to person, place, and time. No cranial nerve deficit. She exhibits normal muscle  tone.  Skin: Skin is warm and dry. No rash noted.  Psychiatric: Her behavior is normal.    ED Course  Procedures None   Labs Review Labs Reviewed  WET PREP, GENITAL - Abnormal; Notable for the following:    Yeast Wet Prep HPF POC FEW (*)    WBC, Wet Prep HPF POC FEW (*)    All other components within normal limits  URINALYSIS, ROUTINE W REFLEX MICROSCOPIC - Abnormal;  Notable for the following:    Hgb urine dipstick MODERATE (*)    All other components within normal limits  URINE MICROSCOPIC-ADD ON - Abnormal; Notable for the following:    Squamous Epithelial / LPF FEW (*)    All other components within normal limits  GC/CHLAMYDIA PROBE AMP  RPR  HIV ANTIBODY (ROUTINE TESTING)  POC URINE PREG, ED   MDM   Final diagnoses:  Vaginal candidiasis   [address] 28 year old [place] female with a history of ST is presents with vaginal itching and discharge for 3 days. Patient is well appearing on exam. She is afebrile, BSS. Abdomen is nontender to palpation. Pelvic exam reveals white vaginal discharge. No cervical lesions. No CMT or adnexal tenderness. GC/Chlamydia, RPR, HIV and wet prep sent. UPT negative. Urinalysis sent as well. UA neg for UTI. Wet prep is positive for yeast. Will treat with single dose of Diflucan and have patient followup with a PCP and pressure for one week. Patient voices understanding and is agreeable with this plan. Patient is stable at discharge.  Case discussed with Dr. Silverio LayYao.   Maris BergerJonah Lagretta Loseke, MD 02/01/14 [phone]

## 2014-02-01 NOTE — ED Provider Notes (Signed)
I saw and evaluated the patient, reviewed the resident's note and I agree with the findings and plan.   EKG Interpretation None      Brooke Christian is a 28 y.o. female here with vaginal itching and discharge for the last 3 days. Abdomen soft, nontender. Pelvic exam performed by resident. Wet prep + WBC and yeast. Given diflucan. Will d/c home.    Richardean Canalavid H Kevon Tench, MD 02/01/14 [phone]

## 2014-02-01 NOTE — ED Notes (Signed)
Pt presents to department for evaluation of vaginal itching and discharge. Ongoing x3 days. Denies urinary symptoms. Pt is alert and oriented x4. NAD.

## 2014-02-01 NOTE — Discharge Instructions (Signed)

## 2014-02-02 LAB — RPR

## 2014-02-02 LAB — HIV ANTIBODY (ROUTINE TESTING W REFLEX): HIV 1&2 Ab, 4th Generation: NONREACTIVE

## 2014-02-03 LAB — GC/CHLAMYDIA PROBE AMP
CT PROBE, AMP APTIMA: NEGATIVE
GC Probe RNA: NEGATIVE

## 2015-05-13 ENCOUNTER — Emergency Department (HOSPITAL_COMMUNITY)
Admission: EM | Admit: 2015-05-13 | Discharge: 2015-05-13 | Disposition: A | Discharge status: Home / Self Care | Payer: Self-pay | Attending: Emergency Medicine | Admitting: Emergency Medicine

## 2015-05-13 ENCOUNTER — Encounter (HOSPITAL_COMMUNITY): Payer: Self-pay | Admitting: *Deleted

## 2015-05-13 DIAGNOSIS — F17210 Nicotine dependence, cigarettes, uncomplicated: Secondary | ICD-10-CM | POA: Insufficient documentation

## 2015-05-13 DIAGNOSIS — K0889 Other specified disorders of teeth and supporting structures: Secondary | ICD-10-CM | POA: Insufficient documentation

## 2015-05-13 DIAGNOSIS — K002 Abnormalities of size and form of teeth: Secondary | ICD-10-CM | POA: Insufficient documentation

## 2015-05-13 DIAGNOSIS — K0381 Cracked tooth: Secondary | ICD-10-CM | POA: Insufficient documentation

## 2015-05-13 DIAGNOSIS — K029 Dental caries, unspecified: Secondary | ICD-10-CM | POA: Insufficient documentation

## 2015-05-13 MED ORDER — PENICILLIN V POTASSIUM 500 MG PO TABS: 500.0000 mg | ORAL_TABLET | Freq: Four times a day (QID) | ORAL | Status: AC

## 2015-05-13 MED ORDER — IBUPROFEN 800 MG PO TABS: 800.0000 mg | ORAL_TABLET | Freq: Three times a day (TID) | ORAL | Status: DC

## 2015-05-13 NOTE — ED Provider Notes (Signed)
CSN: [id number]     Arrival date & time 05/13/15  1703 History  By signing my name below, I, Brooke Christian, attest that this documentation has been prepared under the direction and in the presence of [employer], PA-C. Electronically Signed: Evon Christian, ED Scribe. 05/13/2015. 5:16 PM.      Chief Complaint  Patient presents with  . Dental Pain   The history is provided by the patient. No language interpreter was used.   HPI Comments: Brooke Christian is a 30 y.o. female who presents to the Emergency Department complaining of worsening right sided upper and lower dental pain onset 2 weeks prior. Pt rates the severity of her pain 9/10. Pt reports that she has several dental carries and chipped teeth. Pt states that she has tried ibuprofen with no relief. Does not have a dentist. Pt states that the pain is worse when eating. Pt denies fever, nausea, vomiting or trouble swallowing.   History reviewed. No pertinent past medical history. Past Surgical History  Procedure Laterality Date  . Tubal ligation     History reviewed. No pertinent family history. Social History  Substance Use Topics  . Smoking status: Current Every Day Smoker    Types: Cigarettes  . Smokeless tobacco: None  . Alcohol Use: No   OB History    No data available     Review of Systems A complete 10 system review of systems was obtained and all systems are negative except as noted in the HPI and PMH.     Allergies  Review of patient's allergies indicates no known allergies.  Home Medications   Prior to Admission medications   Medication Sig Start Date End Date Taking? Authorizing Provider  ibuprofen (ADVIL,MOTRIN) 800 MG tablet Take 1 tablet (800 mg total) by mouth 3 (three) times daily. 05/13/15   Joycie Peek, PA-C  penicillin v potassium (VEETID) 500 MG tablet Take 1 tablet (500 mg total) by mouth 4 (four) times daily. 05/13/15 05/20/15  Tyese Finken, PA-C   BP 140/84 mmHg  Pulse 72   Temp(Src) 98.5 F (36.9 C) (Oral)  Resp 18  SpO2 100%  LMP 04/16/2015   Physical Exam  Constitutional: She is oriented to person, place, and time. She appears well-developed and well-nourished. No distress.  HENT:  Head: Normocephalic and atraumatic.  Discomfort located to right maxillary and mandibular molars. Overall poor dentition. Multiple missing teeth with active caries. Mucous membranes are moist. No unilateral tonsillar swelling, uvula midline, no glossal swelling or elevation. No trismus. No fluctuance or evidence of a drainable abscess. No other evidence of emergent infection, Retropharyngeal or Peritonsillar abscess, Ludwig or Vincents angina. Tolerating secretions well. Patent airway   Eyes: Conjunctivae and EOM are normal.  Neck: Neck supple. No tracheal deviation present.  Cardiovascular: Normal rate.   Pulmonary/Chest: Effort normal. No respiratory distress.  Musculoskeletal: Normal range of motion.  Neurological: She is alert and oriented to person, place, and time.  Skin: Skin is warm and dry.  Psychiatric: She has a normal mood and affect. Her behavior is normal.  Nursing note and vitals reviewed.   ED Course  Procedures (including critical care time) DIAGNOSTIC STUDIES: Oxygen Saturation is 100% on RA, normal by my interpretation.    COORDINATION OF CARE: 5:40 PM-Discussed treatment plan with pt at bedside and pt agreed to plan.     Labs Review Labs Reviewed - No data to display  Imaging Review No results found.    EKG Interpretation None  Meds given in ED:  Medications - No data to display  Discharge Medication List as of 05/13/2015  5:48 PM    START taking these medications   Details  ibuprofen (ADVIL,MOTRIN) 800 MG tablet Take 1 tablet (800 mg total) by mouth 3 (three) times daily., Starting 05/13/2015, Until Discontinued, Print    penicillin v potassium (VEETID) 500 MG tablet Take 1 tablet (500 mg total) by mouth 4 (four) times daily.,  Starting 05/13/2015, Until Wed 05/20/15, Print       Filed Vitals:   05/13/15 1717  BP: 140/84  Pulse: 72  Temp: 98.5 F (36.9 C)  TempSrc: Oral  Resp: 18  SpO2: 100%    MDM  Here for evaluation of dental pain. On exam, there is no evidence of a drainable abscess. No trismus, glossal elevation, unilateral tonsillar swelling. No evidence of retropharyngeal or peritonsillar abscess or Ludwig angina. Patient refuses dental block in the ED. Discharged with outpatient dental resources as well as referral to on-call dentistry. Also given prescription for anti-inflammatories and antibiotics. Overall, appears well, nontoxic and appropriate for discharge.  Final diagnoses:  Pain, dental      I personally performed the services described in this documentation, which was scribed in my presence. The recorded information has been reviewed and is accurate.      Joycie PeekBenjamin Alcee Sipos, PA-C 05/13/15 1806  Doug SouSam Jacubowitz, MD 05/14/15 [phone]

## 2015-05-13 NOTE — ED Notes (Signed)
PT reports RT upper dental pain for one week.

## 2015-05-13 NOTE — ED Notes (Signed)
Declined W/C at D/C and was escorted to lobby by RN. 

## 2015-05-13 NOTE — Discharge Instructions (Signed)
It is important for you to follow-up with dentistry for definitive dental care. Take your antibiotics as prescribed. Take your Motrin for your discomfort. Return to ED for any new or worsening symptoms.  [facility]  [facility]  [address]  [place], [place] [address]  Phone [phone]  [facility] [employer] [facility] in [place], exemplifies the [employer] vision to improve the health and quality of life of all [place]ians by Public house manager with a passion to care for the underserved and by leading the nation in community-based, service learning oral health education. We are committed to offering comprehensive general dental services for adults, children and special needs patients in a safe, caring and professional setting.  Appointments: Our clinic is open Monday through Friday 8:00 a.m. until 5:00 p.m. The amount of time scheduled for an appointment depends on the patients specific needs. We ask that you keep your appointed time for care or provide 24-hour notice of all appointment changes. Parents or legal guardians must accompany minor children.  Payment for Services: Medicaid and other insurance plans are welcome. Payment for services is due when services are rendered and may be made by cash or credit card. If you have dental insurance, we will assist you with your claim submission.   Emergencies: Emergency services will be provided Monday through Friday on a walk-in basis. Please arrive early for emergency services. After hours emergency services will be provided for patients of record as required.  Services:  Comprehensive General Dentistry  Childrens Dentistry  Oral Surgery - Extractions  Root Canals  Sealants and Tooth Colored Fillings  Crowns and Bridges  Dentures and Partial Dentures  Implant Services   Periodontal Services and Agricultural engineer  3-D/Cone Beam Imaging   Emergency Department Resource Guide 1) Find a Doctor and Pay Out of Pocket Although you won't have to find out who is covered by your insurance plan, it is a good idea to ask around and get recommendations. You will then need to call the office and see if the doctor you have chosen will accept you as a new patient and what types of options they offer for patients who are self-pay. Some doctors offer discounts or will set up payment plans for their patients who do not have insurance, but you will need to ask so you aren't surprised when you get to your appointment.  2) Contact Your Local Health Department Not all health departments have doctors that can see patients for sick visits, but many do, so it is worth a call to see if yours does. If you don't know where your local health department is, you can check in your phone book. The CDC also has a tool to help you locate your state's health department, and many state websites also have listings of all of their local health departments.  3) Find a Walk-in Clinic If your illness is not likely to be very severe or complicated, you may want to try a walk in clinic. These are popping up all over the country in pharmacies, drugstores, and shopping centers. They're usually staffed by nurse practitioners or physician assistants that have been trained to treat common illnesses and complaints. They're usually fairly quick and inexpensive. However, if you have serious medical issues or chronic medical problems, these are probably not your best option.  No Primary Care Doctor: -  Call Health Connect at  [phone] - they can help you locate a primary care doctor that  accepts your insurance, provides certain services, etc. - Physician Referral Service- [phone]  Chronic Pain Problems: Organization         Address  Phone   Notes  [facility]  [phone] Patients need to be referred by their primary care doctor.   Medication Assistance: Organization         Address  Phone   Notes  [facility] Medication [facility] [address] [place] [place], [place] [address] [phone] --Must be a resident of [facility] -- Must have NO insurance coverage whatsoever (no Medicaid/ Medicare, etc.) -- The pt. MUST have a primary care doctor that directs their care regularly and follows them in the community   MedAssist  [phone]   [employer]  [phone]    Agencies that provide inexpensive medical care: Organization         Address  Phone   Notes  [facility]  [phone]   [facility]    [phone]   [facility] [address] [place], [place] [address] [phone]   [facility] [address] [place] [place] [phone]   Planned Parenthood    [phone]   [facility]    [phone]   [facility]  [address], [place] Phone:  [phone], Fax:  [phone] Hours of Operation:  9 am - 6 pm, M-F.  Also accepts Medicaid/Medicare and self-pay.  [facility]  [address], [place] Phone: [phone], Fax: [phone]. Hours of Operation:  8:30 am - 5:30 pm, M-F.  Also accepts Medicaid and self-pay.  [facility] [place] [address], [place] Phone: [phone]   [facility] [address] Natasha Bence [place], [place] [phone], Ext. [phone] Mondays & Thursdays: 7-9 AM.  First 15 patients are seen on a first come, first serve basis.    Medicaid-accepting [facility] Providers:  Organization         Address  Phone   Notes  [facility] [address], [place] [phone] Also accepts self-pay patients.  [facility] [address] Laurell Josephs  [place], [place]  [phone]   [facility] [address], [place] [phone]   [facility] [address], [place] [phone]   Renaye Rakers [address], [place]   [phone] Only accepts [place] Access [place] patients after they have their name applied to their card.   Self-Pay (no insurance) in [facility]:  Organization         Address  Phone   Notes  Sickle Cell Patients, [facility] [address] [place], [place] [phone]   [facility] [address] [place], [place] [phone]   [facility]  [address], [place] [phone]   [facility]/Dr. Osei-Bonsu  [address] [place] or [address], [place] [phone] Phone number for both [employer] and [place] locations is the same.  [facility] [address] Ginette Otto [phone]   [facility]  [address] [place] or [address] [phone]   [facility] [address] [place], [place] [phone], phone; [phone], fax Sees patients 1st and 3rd Saturday of every month.  Must not qualify for public or private insurance (i.e. Medicaid, Medicare, [place] Health Choice, Veterans' Benefits)  Household income should be no more than 200% of the poverty level The clinic cannot treat you if you are pregnant or think you are pregnant  Sexually transmitted diseases are not treated at the clinic.    Dental Care: Organization         Address  Phone  Notes  [facility] Department of [facility] [address] [place], [place] [phone] Accepts children up to age 83 who are enrolled in [place] or [place] Health Choice; pregnant women with a Medicaid card; and children who have applied for Medicaid or [place]  Health Choice, but were declined, whose parents can pay a reduced fee at time of service.  [facility] of [facility]  [address], [place] [phone] Accepts children up to age 59 who are enrolled in [place] or [place] Health Choice; pregnant women with a Medicaid card; and children who have applied for Medicaid or [place] Health Choice, but were declined, whose parents can pay a reduced fee at time of service.  [facility]  [address] [place], [place] [phone] Patients are seen by appointment only. Walk-ins are not accepted. [facility] will see patients 11 years of age and older. Monday - Tuesday (8am-5pm) Most Wednesdays (8:30-5pm) $30 per visit, cash only  [facility]  [address], [facility] [phone] Patients are seen by appointment only. Walk-ins are not accepted. [facility] will see patients 8 years of age and older. One Wednesday Evening (Monthly: Volunteer Based).  $30 per visit, cash only  [employer]  [phone] for adults; Children under age 84, call Graduate Pediatric Dentistry at [phone]. Children aged 56-14, please call [phone] to request a pediatric application.  Dental services are provided in all areas of dental care including fillings, crowns and bridges, complete and partial dentures, implants, gum treatment, root canals, and extractions. Preventive care is also provided. Treatment is provided to both adults and children. Patients are selected via a lottery and there is often a waiting list.   [facility] [address] [place]  [phone] [email or web address]   [facility] [address] [place], [place] [phone], Ext. [phone] Second and Fourth Thursday of each month, opens at 6:30 AM; Clinic ends at 9 AM.  Patients are seen on a first-come first-served basis, and a limited number are seen during  each clinic.   [facility]  [address] Ether Griffins [place], [place] [phone]   Eligibility Requirements You must have lived in [place], [place], or [place] counties for at least the last three months.   You cannot be eligible for state or federal sponsored [place], including [employer], [place], or [employer].   You generally cannot be eligible for healthcare insurance through your employer.    How to apply: Eligibility screenings are held every Tuesday and Wednesday afternoon from 1:00 pm until 4:00 pm. You do not need an appointment for the interview!  [facility] [address] [place], [place] [phone]   [facility]  Department  [phone]   [facility]  [phone]   [facility]  [phone]    Behavioral Health Resources in the Community: Intensive Outpatient Programs Organization         Address  Phone  Notes  [facility] [address], [place], [place] [phone]   [facility] [address], [place], [place] [phone]   [facility] [address], [place], [place]  [phone]   [facility] [address],  [place], [place] [phone] or [phone]   Substance Abuse Resources Organization         Address  Phone  Notes  [facility]  [phone]   [facility]  [phone]   [facility]  [phone]   [facility]  [phone]   Residential & Outpatient Substance Abuse Program  [phone]   Psychological Services Organization         Address  Phone  Notes  [facility]  [phone]   [facility]  [phone]- [phone]   [facility] [address], [place] [phone] or [phone]    Mobile Crisis Teams Organization         Address  Phone  Notes  [facility], Mobile  Crisis Care Unit  [phone]   [facility]  [address] [place], [place] [phone]   Doristine LocksSharon DeEsch [address] [place] [phone]    Self-Help/Support Groups Organization         Address  Phone             Notes  [facility] [place] - variety of support groups  [phone] Call for more information  Narcotics Anonymous (NA), [facility] [address], [place]  2 meetings at this location   Statisticianesidential Treatment Programs Organization         Address  Phone  Notes  [facility] [address],    [place]  [phone]   [facility]  [address], [place] [phone], [place], [place] [phone]   [facility] [address], [place] [phone] Admissions: 8am-3pm M-F  [facility] [address]    [place], [place] [phone]   [facility] [address], [place], [place] [phone]   [facility] [address]  [place], [place] [phone]   [facility] - Intensive Outpatient [address], [place], [place] [phone]   [facility] ([facility] [address]  [place], [place] [phone] or [phone]   [facility] ([facility]) [address] [place], [place] [phone] Accepts Medicaid  [facility] [address]  [place] [phone] Substance Abuse/Addiction Treatment   [facility] Resources Organization         Address  Phone  Notes  [facility]  [phone]   Angie FavaJulie Brannon, PhD [address] [place], [place]   [phone] or [phone]   [facility]   [address] [place], [place] [phone]   [facility] [address], [place], [place] [phone] Insurance/Medicaid/sponsorship through [employer]  [facility]ith [facility] Families [address]  [place], [place] [phone] [place], [place] [phone]    Dr. Adele Schilder  [phone]   [facility] Dept. 1) [address] [place] 2) [place] 3)  [address], [place] [phone]  [phone]   [place] [phone] or [phone] (After Hours)

## 2015-05-13 NOTE — ED Notes (Signed)
See PA assessment 

## 2015-06-21 ENCOUNTER — Encounter (HOSPITAL_COMMUNITY): Payer: Self-pay | Admitting: *Deleted

## 2015-06-21 ENCOUNTER — Emergency Department (HOSPITAL_COMMUNITY)
Admission: EM | Admit: 2015-06-21 | Discharge: 2015-06-21 | Disposition: A | Discharge status: Home / Self Care | Payer: Self-pay | Attending: Emergency Medicine | Admitting: Emergency Medicine

## 2015-06-21 DIAGNOSIS — K0889 Other specified disorders of teeth and supporting structures: Secondary | ICD-10-CM | POA: Insufficient documentation

## 2015-06-21 DIAGNOSIS — K0381 Cracked tooth: Secondary | ICD-10-CM | POA: Insufficient documentation

## 2015-06-21 DIAGNOSIS — F17210 Nicotine dependence, cigarettes, uncomplicated: Secondary | ICD-10-CM | POA: Insufficient documentation

## 2015-06-21 DIAGNOSIS — Z791 Long term (current) use of non-steroidal anti-inflammatories (NSAID): Secondary | ICD-10-CM | POA: Insufficient documentation

## 2015-06-21 MED ORDER — AMOXICILLIN-POT CLAVULANATE 875-125 MG PO TABS: 1.0000 | ORAL_TABLET | Freq: Two times a day (BID) | ORAL | Status: DC

## 2015-06-21 NOTE — Discharge Instructions (Signed)
[employer]  °School of Dental Medicine  °[facility]  °[address]  °[place], [place] [address]  °Phone [phone]  °The [employer] [facility] in [place], [place], exemplifies the Dental School’s vision to improve the health and quality of life of all [place] by creating leaders with a passion to care for the underserved and by leading the nation in community-based, service learning oral health education. °We are committed to offering comprehensive general dental services for adults, children and special needs patients in a safe, caring and professional setting. ° °Appointments: Our clinic is open Monday through Friday 8:00 a.m. until 5:00 p.m. The amount of time scheduled for an appointment depends on the patient’s specific needs. We ask that you keep your appointed time for care or provide 24-hour notice of all appointment changes. Parents or legal guardians must accompany minor children. ° °Payment for Services: Medicaid and other insurance plans are welcome. Payment for services is due when services are rendered and may be made by cash or credit card. If you have dental insurance, we will assist you with your claim submission.  ° °Emergencies: Emergency services will be provided Monday through Friday on a walk-in basis. Please arrive early for emergency services. After hours emergency services will be provided for patients of record as required. ° °Services:  °Comprehensive General Dentistry  °Children’s Dentistry  °Oral Surgery - Extractions  °Root Canals  °Sealants and Tooth Colored Fillings  °Crowns and Bridges  °Dentures and Partial Dentures  °Implant Services  °Periodontal Services and Cleanings  °Cosmetic Tooth Whitening  °Digital Radiography  °3-D/[facility] Beam Imaging ° ° °

## 2015-06-21 NOTE — ED Notes (Signed)
Declined W/C at D/C and was escorted to lobby by RN. 

## 2015-06-21 NOTE — ED Notes (Signed)
PT reports pain to rt lower tooth . Pt reports tooth is broken.

## 2015-06-21 NOTE — ED Provider Notes (Signed)
CSN: [id number]     Arrival date & time 06/21/15  1612 History   By signing my name below, I, Evon Slack, attest that this documentation has been prepared under the direction and in the presence of [employer], PA-C. Electronically Signed: Evon Slack, ED Scribe. 06/21/2015. 5:59 PM.    Chief Complaint  Patient presents with  . Dental Pain   Patient is a 30 y.o. female presenting with tooth pain. The history is provided by the patient. No language interpreter was used.  Dental Pain  HPI Comments: Brooke Christian is a 30 y.o. female who presents to the Emergency Department complaining of worsening lower right sided dental pain onset 1 week prior. She states that the pain as recently worsened within the last 2 days. She reports associated swelling to the surrounding gingiva. Pt states that the pain radiates to her posterior head and to her right ear. Pt states that the pain is worse when eating. She states she has tried ibuprofen with no relief. Pt states that the tooth has been chipped. Pt denies seeing a dentist. Pt denies fever, nausea or vomiting.   History reviewed. No pertinent past medical history. Past Surgical History  Procedure Laterality Date  . Tubal ligation     History reviewed. No pertinent family history. Social History  Substance Use Topics  . Smoking status: Current Every Day Smoker    Types: Cigarettes  . Smokeless tobacco: None  . Alcohol Use: No   OB History    No data available     Review of Systems  All other systems reviewed and are negative.     Allergies  Review of patient's allergies indicates no known allergies.  Home Medications   Prior to Admission medications   Medication Sig Start Date End Date Taking? Authorizing Provider  amoxicillin-clavulanate (AUGMENTIN) 875-125 MG tablet Take 1 tablet by mouth every 12 (twelve) hours. 06/21/15   Eyvonne Mechanic, PA-C  ibuprofen (ADVIL,MOTRIN) 800 MG tablet Take 1 tablet (800 mg total) by  mouth 3 (three) times daily. 05/13/15   Benjamin Cartner, PA-C   BP 138/87 mmHg  Pulse 75  Temp(Src) 98.2 F (36.8 C) (Oral)  Resp 18  Wt 72.576 kg  SpO2 100%  LMP 06/02/2015   Physical Exam  Constitutional: She is oriented to person, place, and time. She appears well-developed and well-nourished. No distress.  HENT:  Head: Normocephalic and atraumatic.  Mouth/Throat: Uvula is midline, oropharynx is clear and moist and mucous membranes are normal. No oropharyngeal exudate, posterior oropharyngeal edema, posterior oropharyngeal erythema or tonsillar abscesses.  External exam shows no asymmetry of the jaw line or face, no signs of obvious swelling, edema, infection. Full active range of motion of the jaw. Neck is supple with full active range of motion, no tenderness to palpation of the soft tissues  Gumline palpated no obvious signs of infection including warmth, redness, abscess, tenderness. Posterior oropharynx clear with no signs of infection, uvula is midline and rises with phonation, tonsils present and normal in size, symmetrical bilateral, tongue is normal soft touch with full active range of motion, floor mouth is soft nontender.  Eyes: Conjunctivae and EOM are normal. Pupils are equal, round, and reactive to light. Right eye exhibits no discharge. Left eye exhibits no discharge.  Neck: Normal range of motion. Neck supple. No JVD present. No tracheal deviation present. No thyromegaly present.  Cardiovascular: Normal rate.   Pulmonary/Chest: Effort normal. No stridor. No respiratory distress.  Musculoskeletal: Normal range of motion.  Lymphadenopathy:    She has no cervical adenopathy.  Neurological: She is alert and oriented to person, place, and time.  Skin: Skin is warm and dry. No rash noted. She is not diaphoretic. No erythema. No pallor.  Psychiatric: She has a normal mood and affect. Her behavior is normal. Judgment and thought content normal.  Nursing note and vitals  reviewed.   ED Course  Procedures (including critical care time) DIAGNOSTIC STUDIES: Oxygen Saturation is 100% on RA, normal by my interpretation.    COORDINATION OF CARE: 6:00 PM-Discussed treatment plan with pt at bedside and pt agreed to plan.     Labs Review Labs Reviewed - No data to display  Imaging Review No results found.    EKG Interpretation None      MDM   Final diagnoses:  Pain, dental   Labs:  Imaging:  Consults:  Therapeutics:  Discharge Meds:   Assessment/Plan: 30 year old female presents today with uncomplicated dental pain. Patient reports swelling to the gums, she has no appreciable swelling, edema, fluctuance, or any other signs of infectious etiology. Patient has numerous dental caries throughout with poor dentition. Patient was offered dental block here, she refused. Patient requested antibiotics and that she follow up with dental follow-up. Patient was given antibiotics, strict return precautions, verbalized understanding and agreement for today's plan had no further questions or concerns at time of discharge   I personally performed the services described in this documentation, which was scribed in my presence. The recorded information has been reviewed and is accurate.           Eyvonne Mechanic, PA-C 06/21/15 2136  Azalia Bilis, MD 06/22/15 [phone]

## 2016-02-08 ENCOUNTER — Emergency Department (HOSPITAL_COMMUNITY): Payer: Self-pay

## 2016-02-08 ENCOUNTER — Emergency Department (HOSPITAL_COMMUNITY)
Admission: EM | Admit: 2016-02-08 | Discharge: 2016-02-08 | Disposition: A | Discharge status: Home / Self Care | Payer: Self-pay | Attending: Emergency Medicine | Admitting: Emergency Medicine

## 2016-02-08 ENCOUNTER — Encounter (HOSPITAL_COMMUNITY): Payer: Self-pay

## 2016-02-08 DIAGNOSIS — S0121XA Laceration without foreign body of nose, initial encounter: Secondary | ICD-10-CM | POA: Insufficient documentation

## 2016-02-08 DIAGNOSIS — Y999 Unspecified external cause status: Secondary | ICD-10-CM | POA: Insufficient documentation

## 2016-02-08 DIAGNOSIS — S022XXA Fracture of nasal bones, initial encounter for closed fracture: Secondary | ICD-10-CM | POA: Insufficient documentation

## 2016-02-08 DIAGNOSIS — F17210 Nicotine dependence, cigarettes, uncomplicated: Secondary | ICD-10-CM | POA: Insufficient documentation

## 2016-02-08 DIAGNOSIS — Y92410 Unspecified street and highway as the place of occurrence of the external cause: Secondary | ICD-10-CM | POA: Insufficient documentation

## 2016-02-08 DIAGNOSIS — S161XXA Strain of muscle, fascia and tendon at neck level, initial encounter: Secondary | ICD-10-CM | POA: Insufficient documentation

## 2016-02-08 DIAGNOSIS — Y939 Activity, unspecified: Secondary | ICD-10-CM | POA: Insufficient documentation

## 2016-02-08 MED ORDER — CYCLOBENZAPRINE HCL 10 MG PO TABS: 10.0000 mg | ORAL_TABLET | Freq: Two times a day (BID) | ORAL | 0 refills | Status: DC | PRN

## 2016-02-08 MED ORDER — HYDROCODONE-ACETAMINOPHEN 5-325 MG PO TABS
1.0000 | ORAL_TABLET | Freq: Four times a day (QID) | ORAL | 0 refills | Status: DC | PRN
Start: 2016-02-08 — End: 2017-09-23

## 2016-02-08 MED ORDER — NAPROXEN 500 MG PO TABS: 500.0000 mg | ORAL_TABLET | Freq: Two times a day (BID) | ORAL | 0 refills | Status: DC
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
## 2016-02-08 NOTE — ED Notes (Signed)
Pt showing NAD. RR even and unlabored. Voices no questions/concerns at this time. 

## 2016-02-08 NOTE — Discharge Instructions (Signed)
Your will need to follow up with the ENT for your nasal fracture.

## 2016-02-08 NOTE — ED Notes (Signed)
See EDP assessment 

## 2016-08-31 ENCOUNTER — Emergency Department (HOSPITAL_COMMUNITY)
Admission: EM | Admit: 2016-08-31 | Discharge: 2016-08-31 | Disposition: A | Discharge status: Home / Self Care | Payer: Self-pay | Attending: Physician Assistant | Admitting: Physician Assistant

## 2016-08-31 ENCOUNTER — Encounter (HOSPITAL_COMMUNITY): Payer: Self-pay | Admitting: *Deleted

## 2016-08-31 DIAGNOSIS — F17210 Nicotine dependence, cigarettes, uncomplicated: Secondary | ICD-10-CM | POA: Insufficient documentation

## 2016-08-31 DIAGNOSIS — K047 Periapical abscess without sinus: Secondary | ICD-10-CM | POA: Insufficient documentation

## 2016-08-31 MED ORDER — NAPROXEN 375 MG PO TABS: 375.0000 mg | ORAL_TABLET | Freq: Two times a day (BID) | ORAL | 0 refills | Status: DC

## 2016-08-31 MED ORDER — AMOXICILLIN 500 MG PO CAPS: 500.0000 mg | ORAL_CAPSULE | Freq: Three times a day (TID) | ORAL | 0 refills | Status: DC

## 2016-08-31 MED ORDER — NAPROXEN 250 MG PO TABS
375.0000 mg | ORAL_TABLET | Freq: Once | ORAL | Status: AC
  Administered 2016-08-31: 375 mg via ORAL
  Filled 2016-08-31: qty 2

## 2016-08-31 MED ORDER — BUTAMBEN-TETRACAINE-BENZOCAINE 2-2-14 % EX AERO
1.0000 | INHALATION_SPRAY | Freq: Once | CUTANEOUS | Status: AC
  Administered 2016-08-31: 1 via TOPICAL
  Filled 2016-08-31: qty 20

## 2016-08-31 NOTE — ED Triage Notes (Signed)
Pt reports right side dental pain for extended time, began having facial swelling last night. Airway intact.

## 2016-08-31 NOTE — Discharge Instructions (Signed)
I have drainage or dental abscess in the ED. Make sure you rinsed her mouth periodically throughout the day and spit out. Take the naproxen for pain. Do not take any extra ibuprofen, Motrin, Aleve, Advil this medication. Today antibiotics as prescribed. Have given [facility] dentistry follow-up. Return to the ED via felt worsening swelling, difficulty swallowing, difficulty breathing or fever.

## 2016-08-31 NOTE — ED Provider Notes (Signed)
[facility] Provider Note   CSN: [id number] Arrival date & time: 08/31/16  1132  By signing my name below, I, Phillips Climes, attest that this documentation has been prepared under the direction and in the presence of Rise Mu, PA-C.  Electronically Signed: Phillips Climes, Scribe. 08/31/2016. 12:24 PM.  History   Chief Complaint Chief Complaint  Patient presents with  . Dental Pain   Brooke Christian is a 31 y.o. female who presents to the Emergency Department with complaints of her chronic bottom right dental pain with no oropharyngeal involvement. Pt additionally endorses some mild right facial edema.  Per chart review, pt has been seen and d/c for similar pain x4 times since 2014 and has been unable to f/u with a dentist due to lack of insurance.  Pt has been treating her pain at home with Motrin with little relief.  She denies experiencing any other acute sx, including fevers, nausea, vomiting, sore throat, abdominal pain or dyspnea. The patient does not currently have a dentist.  The history is provided by the patient and medical records. No language interpreter was used.   History reviewed. No pertinent past medical history.  There are no active problems to display for this patient.  Past Surgical History:  Procedure Laterality Date  . TUBAL LIGATION      OB History    No data available      Home Medications    Prior to Admission medications   Medication Sig Start Date End Date Taking? Authorizing Provider  amoxicillin-clavulanate (AUGMENTIN) 875-125 MG tablet Take 1 tablet by mouth every 12 (twelve) hours. 06/21/15   Eyvonne Mechanic, PA-C  cyclobenzaprine (FLEXERIL) 10 MG tablet Take 1 tablet (10 mg total) by mouth 2 (two) times daily as needed for muscle spasms. 02/08/16   Hope Orlene Och, NP  HYDROcodone-acetaminophen (NORCO) 5-325 MG tablet Take 1 tablet by mouth every 6 (six) hours as needed. 02/08/16   Hope Orlene Och, NP  ibuprofen (ADVIL,MOTRIN)  800 MG tablet Take 1 tablet (800 mg total) by mouth 3 (three) times daily. 05/13/15   Joycie Peek, PA-C  naproxen (NAPROSYN) 500 MG tablet Take 1 tablet (500 mg total) by mouth 2 (two) times daily. 02/08/16   Hope Orlene Och, NP   Family History History reviewed. No pertinent family history.  Social History Social History  Substance Use Topics  . Smoking status: Current Every Day Smoker    Types: Cigarettes  . Smokeless tobacco: Never Used  . Alcohol use No   Allergies   Patient has no known allergies.  Review of Systems Review of Systems  Constitutional: Negative for fever.  HENT: Positive for dental problem and facial swelling. Negative for sore throat.   Gastrointestinal: Negative for abdominal pain, nausea and vomiting.    Physical Exam Updated Vital Signs BP 129/82 (BP Location: Right Arm)   Pulse 88   Temp 98.4 F (36.9 C) (Oral)   Resp 18   LMP 08/21/2016   SpO2 100%   Physical Exam  Constitutional: She is oriented to person, place, and time. She appears well-developed and well-nourished. No distress.  Non toxic appearing  HENT:  Head: Normocephalic and atraumatic.  Mouth/Throat:    Mild right-sided lower facial swelling. No sublingual or submandibular swelling. Oropharynx clear.  Managing secretions and maintaining airway.  Uvula is midline. Full range of motion of all.  Neck: Normal range of motion. Neck supple.  No cervical adenopathy.  Cardiovascular: Normal rate.   Pulmonary/Chest: Effort  normal.  Lymphadenopathy:    She has no cervical adenopathy.  Neurological: She is alert and oriented to person, place, and time.  Skin: Skin is warm and dry. Capillary refill takes less than 2 seconds.  Psychiatric: She has a normal mood and affect.  Nursing note and vitals reviewed.   ED Treatments / Results  DIAGNOSTIC STUDIES: Oxygen Saturation is 100% on RA, nl by my interpretation.  COORDINATION OF CARE: 12:24 PM Discussed treatment plan with pt at  bedside and pt agreed to plan. She would like to have abscess drained today.  Labs (all labs ordered are listed, but only abnormal results are displayed) Labs Reviewed - No data to display  EKG  EKG Interpretation None       Radiology No results found.  Procedures .Marland KitchenIncision and Drainage Date/Time: 08/31/2016 12:46 PM Performed by: Demetrios Loll T Authorized by: Demetrios Loll T   Consent:    Consent obtained:  Verbal   Consent given by:  Patient   Risks discussed:  Bleeding, incomplete drainage, pain and infection   Alternatives discussed:  No treatment and referral Location:    Type:  Abscess   Location:  Mouth Anesthesia (see MAR for exact dosages):    Anesthesia method:  Topical application Procedure type:    Complexity:  Simple Procedure details:    Incision types:  Stab incision   Scalpel blade:  11   Wound management:  Irrigated with saline   Drainage:  Bloody and purulent   Drainage amount:  Moderate   Wound treatment:  Wound left open   Packing materials:  None Post-procedure details:    Patient tolerance of procedure:  Tolerated well, no immediate complications    (including critical care time)  Medications Ordered in ED Medications  butamben-tetracaine-benzocaine (CETACAINE) spray 1 spray (1 spray Topical Given by Other 08/31/16 1254)  naproxen (NAPROSYN) tablet 375 mg (375 mg Oral Given 08/31/16 1259)     Initial Impression / Assessment and Plan / ED Course  I have reviewed the triage vital signs and the nursing notes.  Pertinent labs & imaging results that were available during my care of the patient were reviewed by me and considered in my medical decision making (see chart for details).     Patient with toothache.  Abscess noted. No purulent drainage. Incision and drainage was performed with copious amount of purulent discharge. Patient felt significant improvement. Abscess was rinsed.  Exam unconcerning for Ludwig's angina or spread of  infection.  Will treat with amoxicillin and pain medicine.  Urged patient to follow-up with dentist.     Final Clinical Impressions(s) / ED Diagnoses   Final diagnoses:  Dental abscess    New Prescriptions Discharge Medication List as of 08/31/2016 12:56 PM    START taking these medications   Details  amoxicillin (AMOXIL) 500 MG capsule Take 1 capsule (500 mg total) by mouth 3 (three) times daily., Starting Wed 08/31/2016, Print       I personally performed the services described in this documentation, which was scribed in my presence. The recorded information has been reviewed and is accurate.    Rise Mu, PA-C 08/31/16 1357    Abelino Derrick, MD 09/04/16 [phone]

## 2016-09-20 ENCOUNTER — Emergency Department (HOSPITAL_COMMUNITY)
Admission: EM | Admit: 2016-09-20 | Discharge: 2016-09-20 | Disposition: A | Discharge status: Home / Self Care | Payer: Self-pay | Attending: Emergency Medicine | Admitting: Emergency Medicine

## 2016-09-20 ENCOUNTER — Encounter (HOSPITAL_COMMUNITY): Payer: Self-pay | Admitting: Emergency Medicine

## 2016-09-20 DIAGNOSIS — F17210 Nicotine dependence, cigarettes, uncomplicated: Secondary | ICD-10-CM | POA: Insufficient documentation

## 2016-09-20 DIAGNOSIS — N760 Acute vaginitis: Secondary | ICD-10-CM | POA: Insufficient documentation

## 2016-09-20 DIAGNOSIS — B9689 Other specified bacterial agents as the cause of diseases classified elsewhere: Secondary | ICD-10-CM | POA: Insufficient documentation

## 2016-09-20 LAB — URINALYSIS, ROUTINE W REFLEX MICROSCOPIC
BACTERIA UA: NONE SEEN
Bilirubin Urine: NEGATIVE
Glucose, UA: NEGATIVE mg/dL
Ketones, ur: NEGATIVE mg/dL
NITRITE: NEGATIVE
PROTEIN: NEGATIVE mg/dL
Specific Gravity, Urine: 1.023 (ref 1.005–1.030)
pH: 5 (ref 5.0–8.0)

## 2016-09-20 LAB — POC URINE PREG, ED: Preg Test, Ur: NEGATIVE

## 2016-09-20 LAB — WET PREP, GENITAL
Sperm: NONE SEEN
Trich, Wet Prep: NONE SEEN
YEAST WET PREP: NONE SEEN

## 2016-09-20 MED ORDER — METRONIDAZOLE 500 MG PO TABS: 500.0000 mg | ORAL_TABLET | Freq: Two times a day (BID) | ORAL | 0 refills | Status: DC

## 2016-09-20 NOTE — ED Triage Notes (Signed)
Pt sts recently took amoxicillin and now having sx of yeast infection

## 2016-09-20 NOTE — Discharge Instructions (Signed)
Your wet prep was positive for bacterial vaginosis. This is a common occurrence following use of antibiotics. Please take all of your antibiotics until finished!   You may develop abdominal discomfort or diarrhea from the antibiotic.  You may help offset this with probiotics which you can buy or get in yogurt. Do not eat or take the probiotics until 2 hours after your antibiotic.  Follow-up with OB/GYN for any further management of this issue should symptoms fail to resolve.

## 2016-09-20 NOTE — ED Provider Notes (Signed)
[facility] Provider Note   CSN: [id number] Arrival date & time: 09/20/16  4098     History   Chief Complaint Chief Complaint  Patient presents with  . Vaginal Discharge    HPI Brooke Christian is a 31 y.o. female.  HPI   Brooke Christian is a 31 y.o. female, patient with no pertinent past medical history, presenting to the ED with Abnormal vaginal discharge for the last 2 weeks. Patient states she was prescribed amoxicillin for a dental infection on May 2. She began to have yellowish, thick, increased vaginal discharge shortly thereafter. Accompanied by burning and itching. Used OTC Monistat without relief. Denies fever/chills, nausea/vomiting, abdominal pain, urinary complaints, or any other complaints.  Patient is sexually active with 1 female partner. LMP April 20 and may be starting again.  Additionally, I inquired about the patient's facial pain for which she presented to the ED on May 2. She states she was able to be treated by a dentist, dental extraction was performed, and she has been pain-free.   History reviewed. No pertinent past medical history.  There are no active problems to display for this patient.   Past Surgical History:  Procedure Laterality Date  . TUBAL LIGATION      OB History    No data available       Home Medications    Prior to Admission medications   Medication Sig Start Date End Date Taking? Authorizing Provider  naproxen (NAPROSYN) 375 MG tablet Take 1 tablet (375 mg total) by mouth 2 (two) times daily. 08/31/16  Yes Demetrios Loll T, PA-C  amoxicillin (AMOXIL) 500 MG capsule Take 1 capsule (500 mg total) by mouth 3 (three) times daily. Patient not taking: Reported on 09/20/2016 08/31/16   Demetrios Loll T, PA-C  amoxicillin-clavulanate (AUGMENTIN) 875-125 MG tablet Take 1 tablet by mouth every 12 (twelve) hours. Patient not taking: Reported on 09/20/2016 06/21/15   Hedges, Tinnie Gens, PA-C  cyclobenzaprine (FLEXERIL) 10 MG tablet  Take 1 tablet (10 mg total) by mouth 2 (two) times daily as needed for muscle spasms. Patient not taking: Reported on 09/20/2016 02/08/16   Janne Napoleon, NP  HYDROcodone-acetaminophen (NORCO) 5-325 MG tablet Take 1 tablet by mouth every 6 (six) hours as needed. Patient not taking: Reported on 09/20/2016 02/08/16   Janne Napoleon, NP  ibuprofen (ADVIL,MOTRIN) 800 MG tablet Take 1 tablet (800 mg total) by mouth 3 (three) times daily. Patient not taking: Reported on 09/20/2016 05/13/15   Joycie Peek, PA-C  metroNIDAZOLE (FLAGYL) 500 MG tablet Take 1 tablet (500 mg total) by mouth 2 (two) times daily. 09/20/16   Anselm Pancoast, PA-C    Family History History reviewed. No pertinent family history.  Social History Social History  Substance Use Topics  . Smoking status: Current Every Day Smoker    Types: Cigarettes  . Smokeless tobacco: Never Used  . Alcohol use No     Allergies   Patient has no known allergies.   Review of Systems Review of Systems  Constitutional: Negative for chills, diaphoresis and fever.  Respiratory: Negative for shortness of breath.   Gastrointestinal: Negative for abdominal pain, nausea and vomiting.  Genitourinary: Positive for vaginal discharge. Negative for dysuria, flank pain, hematuria, pelvic pain and urgency.  Musculoskeletal: Negative for back pain.  All other systems reviewed and are negative.    Physical Exam Updated Vital Signs BP 131/82 (BP Location: Left Arm)   Pulse 93   Temp 99.2 F (37.3 C) (  Oral)   Resp 14   LMP 08/21/2016   SpO2 100%   Physical Exam  Constitutional: She appears well-developed and well-nourished. No distress.  HENT:  Head: Normocephalic and atraumatic.  Eyes: Conjunctivae are normal.  Neck: Neck supple.  Cardiovascular: Normal rate, regular rhythm and intact distal pulses.   Pulmonary/Chest: Effort normal. No respiratory distress.  Abdominal: Soft. There is no tenderness. There is no guarding.  Genitourinary:    Genitourinary Comments: External genitalia normal Vagina with discharge - Moderate amount of thick, yellowish discharge in the vaginal vault and cervical os. Cervix  normal negative for cervical motion tenderness Adnexa palpated, no masses, negative for tenderness noted Bladder palpated negative for tenderness Uterus palpated no masses, negative for tenderness  Otherwise normal female genitalia. Med Tech, [employer], served as Biomedical engineerchaperone during exam.  Musculoskeletal: She exhibits no edema.  Lymphadenopathy:    She has no cervical adenopathy.  Neurological: She is alert.  Skin: Skin is warm and dry. She is not diaphoretic.  Psychiatric: She has a normal mood and affect. Her behavior is normal.  Nursing note and vitals reviewed.    ED Treatments / Results  Labs (all labs ordered are listed, but only abnormal results are displayed) Labs Reviewed  WET PREP, GENITAL - Abnormal; Notable for the following:       Result Value   Clue Cells Wet Prep HPF POC PRESENT (*)    WBC, Wet Prep HPF POC MANY (*)    All other components within normal limits  URINALYSIS, ROUTINE W REFLEX MICROSCOPIC - Abnormal; Notable for the following:    Hgb urine dipstick MODERATE (*)    Leukocytes, UA SMALL (*)    Squamous Epithelial / LPF 0-5 (*)    All other components within normal limits  RPR  HIV ANTIBODY (ROUTINE TESTING)  I-STAT BETA HCG BLOOD, ED ([facility], [facility], [facility] ONLY)  POC URINE PREG, ED  GC/CHLAMYDIA PROBE AMP ([place]) [facility]    EKG  EKG Interpretation None       Radiology No results found.  Procedures Pelvic exam Date/Time: 09/20/2016 10:25 AM Performed by: Anselm PancoastJOY, Majesti Gambrell C Authorized by: Harolyn RutherfordJOY, Kalden Wanke C  Consent: Verbal consent obtained. Risks and benefits: risks, benefits and alternatives were discussed Consent given by: patient Patient understanding: patient states understanding of the procedure being performed Patient consent: the patient's understanding of the procedure matches  consent given Procedure consent: procedure consent matches procedure scheduled Patient identity confirmed: verbally with patient and arm band Local anesthesia used: no  Anesthesia: Local anesthesia used: no  Sedation: Patient sedated: no Patient tolerance: Patient tolerated the procedure well with no immediate complications    (including critical care time)  Medications Ordered in ED Medications - No data to display   Initial Impression / Assessment and Plan / ED Course  I have reviewed the triage vital signs and the nursing notes.  Pertinent labs & imaging results that were available during my care of the patient were reviewed by me and considered in my medical decision making (see chart for details).     Patient presents with vaginal discharge. Low suspicion for STD. BV on wet prep. OB/GYN follow-up as needed.   Final Clinical Impressions(s) / ED Diagnoses   Final diagnoses:  BV (bacterial vaginosis)    New Prescriptions New Prescriptions   METRONIDAZOLE (FLAGYL) 500 MG TABLET    Take 1 tablet (500 mg total) by mouth 2 (two) times daily.     Anselm PancoastJoy, Clarence Cogswell C, PA-C 09/20/16 1209  Linwood Dibbles, MD 09/22/16 [phone]

## 2016-09-21 LAB — GC/CHLAMYDIA PROBE AMP (~~LOC~~) NOT AT ARMC
Chlamydia: NEGATIVE
NEISSERIA GONORRHEA: NEGATIVE

## 2016-09-21 LAB — HIV ANTIBODY (ROUTINE TESTING W REFLEX): HIV Screen 4th Generation wRfx: NONREACTIVE

## 2016-11-20 ENCOUNTER — Emergency Department (HOSPITAL_COMMUNITY)
Admission: EM | Admit: 2016-11-20 | Discharge: 2016-11-20 | Disposition: A | Discharge status: Home / Self Care | Payer: Self-pay | Attending: Emergency Medicine | Admitting: Emergency Medicine

## 2016-11-20 ENCOUNTER — Encounter (HOSPITAL_COMMUNITY): Payer: Self-pay | Admitting: Emergency Medicine

## 2016-11-20 DIAGNOSIS — A64 Unspecified sexually transmitted disease: Secondary | ICD-10-CM | POA: Insufficient documentation

## 2016-11-20 DIAGNOSIS — F17210 Nicotine dependence, cigarettes, uncomplicated: Secondary | ICD-10-CM | POA: Insufficient documentation

## 2016-11-20 DIAGNOSIS — Z79899 Other long term (current) drug therapy: Secondary | ICD-10-CM | POA: Insufficient documentation

## 2016-11-20 DIAGNOSIS — A599 Trichomoniasis, unspecified: Secondary | ICD-10-CM | POA: Insufficient documentation

## 2016-11-20 LAB — WET PREP, GENITAL
CLUE CELLS WET PREP: NONE SEEN
SPERM: NONE SEEN
YEAST WET PREP: NONE SEEN

## 2016-11-20 LAB — URINALYSIS, ROUTINE W REFLEX MICROSCOPIC
Bilirubin Urine: NEGATIVE
Glucose, UA: NEGATIVE mg/dL
Ketones, ur: NEGATIVE mg/dL
Nitrite: NEGATIVE
Protein, ur: NEGATIVE mg/dL
Specific Gravity, Urine: 1.009 (ref 1.005–1.030)
pH: 6 (ref 5.0–8.0)

## 2016-11-20 LAB — POC URINE PREG, ED: Preg Test, Ur: NEGATIVE

## 2016-11-20 MED ORDER — DOXYCYCLINE HYCLATE 100 MG PO TABS
100.0000 mg | ORAL_TABLET | Freq: Once | ORAL | Status: AC
  Administered 2016-11-20: 100 mg via ORAL
  Filled 2016-11-20: qty 1

## 2016-11-20 MED ORDER — LIDOCAINE HCL (PF) 1 % IJ SOLN
0.9000 mL | Freq: Once | INTRAMUSCULAR | Status: AC
  Administered 2016-11-20: 0.9 mL
  Filled 2016-11-20: qty 5

## 2016-11-20 MED ORDER — METRONIDAZOLE 500 MG PO TABS
500.0000 mg | ORAL_TABLET | Freq: Once | ORAL | Status: AC
  Administered 2016-11-20: 500 mg via ORAL
  Filled 2016-11-20: qty 1

## 2016-11-20 MED ORDER — DOXYCYCLINE HYCLATE 100 MG PO CAPS: 100.0000 mg | ORAL_CAPSULE | Freq: Two times a day (BID) | ORAL | 0 refills | Status: AC

## 2016-11-20 MED ORDER — CEFTRIAXONE SODIUM 250 MG IJ SOLR
250.0000 mg | Freq: Once | INTRAMUSCULAR | Status: AC
  Administered 2016-11-20: 250 mg via INTRAMUSCULAR
  Filled 2016-11-20: qty 250

## 2016-11-20 MED ORDER — METRONIDAZOLE 500 MG PO TABS
1500.0000 mg | ORAL_TABLET | Freq: Once | ORAL | Status: AC
  Administered 2016-11-20: 1500 mg via ORAL
  Filled 2016-11-20: qty 3

## 2016-11-20 NOTE — ED Triage Notes (Signed)
Pt reports vaginal discomfort and heavy yellow discharge x1 week. States she had a bacterial infection about 1 month ago that she used flagyl for and feels like it has returned. Denies any urinary symptoms.

## 2016-11-20 NOTE — ED Notes (Signed)
ED Provider at bedside. 

## 2016-11-20 NOTE — ED Notes (Signed)
Pt made aware of need for urine sample.  

## 2016-11-20 NOTE — ED Provider Notes (Signed)
[facility]-EMERGENCY DEPT Provider Note   CSN: [id number] Arrival date & time: 11/20/16  0720     History   Chief Complaint Chief Complaint  Patient presents with  . vaginal discomfort    HPI Brooke Christian is a 31 y.o. female.  HPI  76 old female with past medical history as below here with vaginal discomfort. Patient states that for the last 2 weeks, she has had progressively worsening vaginal discharge. She endorses foul-smelling, copious yellow-green discharge. She is having pads due to the amount of drainage. She has a history of bacterial vaginosis and states it is worse than that was. She was last treated 2 months ago with Flagyl. She denies any pelvic pain. Denies any dyspareunia. She is sexually active with a single partner and occasionally does not use protection. No nausea or vomiting. No right upper quadrant pain.  History reviewed. No pertinent past medical history.  There are no active problems to display for this patient.   Past Surgical History:  Procedure Laterality Date  . TUBAL LIGATION      OB History    No data available       Home Medications    Prior to Admission medications   Medication Sig Start Date End Date Taking? Authorizing Provider  amoxicillin (AMOXIL) 500 MG capsule Take 1 capsule (500 mg total) by mouth 3 (three) times daily. Patient not taking: Reported on 09/20/2016 08/31/16   Demetrios Loll T, PA-C  amoxicillin-clavulanate (AUGMENTIN) 875-125 MG tablet Take 1 tablet by mouth every 12 (twelve) hours. Patient not taking: Reported on 09/20/2016 06/21/15   Hedges, Tinnie Gens, PA-C  cyclobenzaprine (FLEXERIL) 10 MG tablet Take 1 tablet (10 mg total) by mouth 2 (two) times daily as needed for muscle spasms. Patient not taking: Reported on 09/20/2016 02/08/16   Janne Napoleon, NP  doxycycline (VIBRAMYCIN) 100 MG capsule Take 1 capsule (100 mg total) by mouth 2 (two) times daily. 11/20/16 12/04/16  Shaune Pollack, MD  HYDROcodone-acetaminophen (NORCO)  5-325 MG tablet Take 1 tablet by mouth every 6 (six) hours as needed. Patient not taking: Reported on 09/20/2016 02/08/16   Janne Napoleon, NP  ibuprofen (ADVIL,MOTRIN) 800 MG tablet Take 1 tablet (800 mg total) by mouth 3 (three) times daily. Patient not taking: Reported on 09/20/2016 05/13/15   Joycie Peek, PA-C  metroNIDAZOLE (FLAGYL) 500 MG tablet Take 1 tablet (500 mg total) by mouth 2 (two) times daily. Patient not taking: Reported on 11/20/2016 09/20/16   Harolyn Rutherford C, PA-C  naproxen (NAPROSYN) 375 MG tablet Take 1 tablet (375 mg total) by mouth 2 (two) times daily. Patient not taking: Reported on 11/20/2016 08/31/16   Rise Mu, PA-C    Family History No family history on file.  Social History Social History  Substance Use Topics  . Smoking status: Current Every Day Smoker    Types: Cigarettes  . Smokeless tobacco: Never Used  . Alcohol use No     Allergies   Patient has no known allergies.   Review of Systems Review of Systems  Constitutional: Negative for fever.  Genitourinary: Positive for vaginal discharge.  All other systems reviewed and are negative.    Physical Exam Updated Vital Signs BP 125/79   Pulse 80   Temp 98.3 F (36.8 C) (Oral)   Resp 15   SpO2 99%   Physical Exam  Constitutional: She is oriented to person, place, and time. She appears well-developed and well-nourished. No distress.  HENT:  Head: Normocephalic and atraumatic.  Eyes: Conjunctivae are normal.  Neck: Neck supple.  Cardiovascular: Normal rate, regular rhythm and normal heart sounds.  Exam reveals no friction rub.   No murmur heard. Pulmonary/Chest: Effort normal and breath sounds normal. No respiratory distress. She has no wheezes. She has no rales.  Abdominal: She exhibits no distension.  Genitourinary:  Genitourinary Comments: Copious, purulent vaginal discharge. No apparent discharge from the os, although there is mild cervical motion tenderness. No adnexal  tenderness. Mild erythema diffusely of the vaginal mucosa.  Musculoskeletal: She exhibits no edema.  Neurological: She is alert and oriented to person, place, and time. She exhibits normal muscle tone.  Skin: Skin is warm. Capillary refill takes less than 2 seconds.  Psychiatric: She has a normal mood and affect.  Nursing note and vitals reviewed.    ED Treatments / Results  Labs (all labs ordered are listed, but only abnormal results are displayed) Labs Reviewed  WET PREP, GENITAL - Abnormal; Notable for the following:       Result Value   Trich, Wet Prep PRESENT (*)    WBC, Wet Prep HPF POC MANY (*)    All other components within normal limits  URINALYSIS, ROUTINE W REFLEX MICROSCOPIC - Abnormal; Notable for the following:    Color, Urine STRAW (*)    Hgb urine dipstick MODERATE (*)    Leukocytes, UA LARGE (*)    Bacteria, UA RARE (*)    Squamous Epithelial / LPF 6-30 (*)    All other components within normal limits  HIV ANTIBODY (ROUTINE TESTING)  POC URINE PREG, ED  GC/CHLAMYDIA PROBE AMP ([place]) NOT AT [facility]    EKG  EKG Interpretation None       Radiology No results found.  Procedures Procedures (including critical care time)  Medications Ordered in ED Medications  cefTRIAXone (ROCEPHIN) injection 250 mg (250 mg Intramuscular Given 11/20/16 0900)  doxycycline (VIBRA-TABS) tablet 100 mg (100 mg Oral Given 11/20/16 0900)  metroNIDAZOLE (FLAGYL) tablet 500 mg (500 mg Oral Given 11/20/16 0900)  lidocaine (PF) (XYLOCAINE) 1 % injection 0.9 mL (0.9 mLs Other Given 11/20/16 0900)  metroNIDAZOLE (FLAGYL) tablet 1,500 mg (1,500 mg Oral Given 11/20/16 0941)     Initial Impression / Assessment and Plan / ED Course  I have reviewed the triage vital signs and the nursing notes.  Pertinent labs & imaging results that were available during my care of the patient were reviewed by me and considered in my medical decision making (see chart for details).     31 year old  female here with copious yellow-green vaginal discharge. Wet prep is positive for trichomoniasis and exam is consistent with vaginitis with possible PID. Patient given Rocephin, Flagyl, and doxycycline here. Otherwise, she is afebrile and hemodynamically well appearing. No signs of FHC or sepsis. I discussed her diagnosis of STD and advised her partner to have testing. Will DC with doxycycline outpatient follow-up. Otherwise, no evidence of recurrent BV.   Final Clinical Impressions(s) / ED Diagnoses   Final diagnoses:  Trichomoniasis  STD (female)    New Prescriptions Discharge Medication List as of 11/20/2016  9:57 AM    START taking these medications   Details  doxycycline (VIBRAMYCIN) 100 MG capsule Take 1 capsule (100 mg total) by mouth 2 (two) times daily., Starting Sun 11/20/2016, Until Sun 12/04/2016, Print         Shaune PollackIsaacs, Emojean Gertz, MD 11/20/16 2137

## 2016-11-21 LAB — GC/CHLAMYDIA PROBE AMP (~~LOC~~) NOT AT ARMC
CHLAMYDIA, DNA PROBE: NEGATIVE
NEISSERIA GONORRHEA: NEGATIVE

## 2016-11-23 LAB — HIV ANTIBODY (ROUTINE TESTING W REFLEX): HIV Screen 4th Generation wRfx: NONREACTIVE

## 2017-05-23 DIAGNOSIS — A5901 Trichomonal vulvovaginitis: Secondary | ICD-10-CM | POA: Diagnosis not present

## 2017-05-23 DIAGNOSIS — Z114 Encounter for screening for human immunodeficiency virus [HIV]: Secondary | ICD-10-CM | POA: Diagnosis not present

## 2017-05-23 DIAGNOSIS — Z113 Encounter for screening for infections with a predominantly sexual mode of transmission: Secondary | ICD-10-CM | POA: Diagnosis not present

## 2017-09-23 ENCOUNTER — Encounter (HOSPITAL_COMMUNITY): Payer: Self-pay

## 2017-09-23 ENCOUNTER — Emergency Department (HOSPITAL_COMMUNITY)
Admission: EM | Admit: 2017-09-23 | Discharge: 2017-09-23 | Disposition: A | Discharge status: Home / Self Care | Payer: Medicaid Other | Attending: Emergency Medicine | Admitting: Emergency Medicine

## 2017-09-23 ENCOUNTER — Emergency Department (HOSPITAL_COMMUNITY): Payer: Medicaid Other

## 2017-09-23 DIAGNOSIS — Y92410 Unspecified street and highway as the place of occurrence of the external cause: Secondary | ICD-10-CM | POA: Insufficient documentation

## 2017-09-23 DIAGNOSIS — S39012A Strain of muscle, fascia and tendon of lower back, initial encounter: Secondary | ICD-10-CM | POA: Insufficient documentation

## 2017-09-23 DIAGNOSIS — Y939 Activity, unspecified: Secondary | ICD-10-CM | POA: Insufficient documentation

## 2017-09-23 DIAGNOSIS — S161XXA Strain of muscle, fascia and tendon at neck level, initial encounter: Secondary | ICD-10-CM | POA: Diagnosis not present

## 2017-09-23 DIAGNOSIS — F17210 Nicotine dependence, cigarettes, uncomplicated: Secondary | ICD-10-CM | POA: Insufficient documentation

## 2017-09-23 DIAGNOSIS — Y998 Other external cause status: Secondary | ICD-10-CM | POA: Insufficient documentation

## 2017-09-23 DIAGNOSIS — S199XXA Unspecified injury of neck, initial encounter: Secondary | ICD-10-CM | POA: Diagnosis present

## 2017-09-23 MED ORDER — IBUPROFEN 800 MG PO TABS: 800.0000 mg | ORAL_TABLET | Freq: Once | ORAL | Status: DC

## 2017-09-23 MED ORDER — CYCLOBENZAPRINE HCL 10 MG PO TABS: 10.0000 mg | ORAL_TABLET | Freq: Two times a day (BID) | ORAL | 0 refills | Status: DC | PRN

## 2017-09-23 MED ORDER — IBUPROFEN 800 MG PO TABS: 800.0000 mg | ORAL_TABLET | Freq: Three times a day (TID) | ORAL | 0 refills | Status: DC
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
## 2019-07-08 ENCOUNTER — Encounter (HOSPITAL_COMMUNITY): Payer: Self-pay | Admitting: Emergency Medicine

## 2019-07-08 ENCOUNTER — Other Ambulatory Visit: Payer: Self-pay

## 2019-07-08 ENCOUNTER — Ambulatory Visit (HOSPITAL_COMMUNITY)
Admission: EM | Admit: 2019-07-08 | Discharge: 2019-07-08 | Disposition: A | Discharge status: Home / Self Care | Payer: Medicaid Other | Attending: Physician Assistant | Admitting: Physician Assistant

## 2019-07-08 DIAGNOSIS — R12 Heartburn: Secondary | ICD-10-CM

## 2019-07-08 DIAGNOSIS — R110 Nausea: Secondary | ICD-10-CM

## 2019-07-08 DIAGNOSIS — K5900 Constipation, unspecified: Secondary | ICD-10-CM

## 2019-07-08 MED ORDER — ONDANSETRON HCL 4 MG PO TABS: 4.0000 mg | ORAL_TABLET | Freq: Three times a day (TID) | ORAL | 0 refills | Status: DC | PRN

## 2019-07-08 MED ORDER — OMEPRAZOLE 20 MG PO CPDR: 20.0000 mg | DELAYED_RELEASE_CAPSULE | Freq: Two times a day (BID) | ORAL | 0 refills | Status: DC

## 2019-07-08 MED ORDER — POLYETHYLENE GLYCOL 3350 17 GM/SCOOP PO POWD: 17.0000 g | Freq: Every day | ORAL | 0 refills | Status: DC

## 2019-07-08 MED ORDER — LIDOCAINE VISCOUS HCL 2 % MT SOLN
15.0000 mL | Freq: Once | OROMUCOSAL | Status: AC
  Administered 2019-07-08: 15 mL via ORAL

## 2019-07-08 MED ORDER — ALUM & MAG HYDROXIDE-SIMETH 200-200-20 MG/5ML PO SUSP
30.0000 mL | Freq: Once | ORAL | Status: AC
  Administered 2019-07-08: 30 mL via ORAL

## 2019-07-08 MED ORDER — ALUM & MAG HYDROXIDE-SIMETH 200-200-20 MG/5ML PO SUSP
ORAL | Status: AC
  Filled 2019-07-08: qty 30

## 2019-07-08 MED ORDER — LIDOCAINE VISCOUS HCL 2 % MT SOLN
OROMUCOSAL | Status: AC
  Filled 2019-07-08: qty 15

## 2019-07-08 NOTE — ED Provider Notes (Signed)
[facility]    CSN: [id number] Arrival date & time: 07/08/19  6789      History   Chief Complaint Chief Complaint  Patient presents with  . Nausea    HPI Brooke Christian is a 34 y.o. female.   Patient reports urgent care today for evaluation of 1 week of nausea and abdominal discomfort.  She reports nausea began 1 week ago and was accompanied by abdominal cramping.  She reports 2 episodes of vomiting the day following her symptoms starting.  Denies blood in her vomit or dark color to her vomit.  She reports his primary been yellow in appearance.  She has not vomited since.  She reports having a tooth removed 6 days ago and was placed on ibuprofen and amoxicillin.  However she denies taking amoxicillin out of concern for yeast infection.  She reports taking 3-4 doses of ibuprofen and Aleve around the time of her nausea and abdominal discomfort starting.  She describes the discomfort as burning and cramping sensation in her upper abdomen.  She is been drinking and eating normally for the most part she reports.  She reports that this pain in her upper stomach was worsened with eating pizza over the weekend.  She does not have a history of heartburn or gastroesophageal reflux disease.  She also reports she believes has not had a bowel movement since the day of her dental procedure which was 6 days ago.  Since then she has felt bloating but has continued to pass small amounts of gas.  She does not feel as though she needs to have a bowel movement.  She does not have a significant history of constipation and was having regular bowel movements prior to her dental procedure and symptoms beginning.  She denies using any opiate type pain medications.  Denies fever and chills or other upper respiratory symptoms.     History reviewed. No pertinent past medical history.  There are no problems to display for this patient.   Past Surgical History:  Procedure Laterality Date  . TUBAL  LIGATION      OB History   No obstetric history on file.      Home Medications    Prior to Admission medications   Medication Sig Start Date End Date Taking? Authorizing Provider  cyclobenzaprine (FLEXERIL) 10 MG tablet Take 1 tablet (10 mg total) by mouth 2 (two) times daily as needed for muscle spasms. Patient not taking: Reported on 07/08/2019 09/23/17   McDonald, Pedro Earls A, PA-C  ibuprofen (ADVIL,MOTRIN) 800 MG tablet Take 1 tablet (800 mg total) by mouth 3 (three) times daily. 09/23/17   McDonald, Mia A, PA-C  omeprazole (PRILOSEC) 20 MG capsule Take 1 capsule (20 mg total) by mouth 2 (two) times daily before a meal for 14 days. 07/08/19 07/22/19  Lateasha Breuer, Veryl Speak, PA-C  ondansetron (ZOFRAN) 4 MG tablet Take 1 tablet (4 mg total) by mouth every 8 (eight) hours as needed for nausea or vomiting. 07/08/19   Arnetha Silverthorne, Veryl Speak, PA-C  polyethylene glycol powder (GLYCOLAX/MIRALAX) 17 GM/SCOOP powder Take 17 g by mouth daily. 07/08/19   Yuli Lanigan, Veryl Speak, PA-C    Family History Family History  Problem Relation Age of Onset  . Healthy Mother   . Healthy Father     Social History Social History   Tobacco Use  . Smoking status: Current Every Day Smoker    Types: Cigarettes  . Smokeless tobacco: Never Used  Substance Use Topics  . Alcohol  use: No  . Drug use: No     Allergies   Patient has no known allergies.   Review of Systems Review of Systems  Constitutional: Positive for appetite change. Negative for chills and fever.  HENT: Negative.   Eyes: Negative for pain and visual disturbance.  Respiratory: Negative for cough and shortness of breath.   Cardiovascular: Negative for chest pain and palpitations.  Gastrointestinal: Positive for abdominal pain, constipation, nausea and vomiting. Negative for anal bleeding, blood in stool, diarrhea and rectal pain.  Genitourinary: Negative for dysuria, hematuria and urgency.  Musculoskeletal: Negative for arthralgias, back pain and myalgias.  Skin:  Negative for color change and rash.  Neurological: Negative for seizures, syncope and headaches.  All other systems reviewed and are negative.    Physical Exam Triage Vital Signs ED Triage Vitals [07/08/19 1004]  Enc Vitals Group     BP 124/82     Pulse Rate 77     Resp 18     Temp 98.4 F (36.9 C)     Temp Source Oral     SpO2 100 %     Weight      Height      Head Circumference      Peak Flow      Pain Score 3     Pain Loc      Pain Edu?      Excl. in GC?    No data found.  Updated Vital Signs BP 124/82 (BP Location: Right Arm)   Pulse 77   Temp 98.4 F (36.9 C) (Oral)   Resp 18   SpO2 100%   Visual Acuity Right Eye Distance:   Left Eye Distance:   Bilateral Distance:    Right Eye Near:   Left Eye Near:    Bilateral Near:     Physical Exam Vitals and nursing note reviewed.  Constitutional:      General: She is not in acute distress.    Appearance: She is well-developed. She is not toxic-appearing or diaphoretic.     Comments: Patient laying on the exam table curled up at times however able to carry on full conversation without distress  HENT:     Head: Normocephalic and atraumatic.  Eyes:     Extraocular Movements: Extraocular movements intact.     Conjunctiva/sclera: Conjunctivae normal.     Pupils: Pupils are equal, round, and reactive to light.  Cardiovascular:     Rate and Rhythm: Normal rate and regular rhythm.     Heart sounds: No murmur.  Pulmonary:     Effort: Pulmonary effort is normal. No respiratory distress.     Breath sounds: Normal breath sounds.  Abdominal:     General: Bowel sounds are normal. There is no distension (Epigastric and right upper quadrant.).     Palpations: Abdomen is soft.     Tenderness: There is abdominal tenderness. There is no right CVA tenderness, left CVA tenderness, guarding or rebound.     Comments: Murphy sign negative  Musculoskeletal:     Cervical back: Neck supple.     Right lower leg: No edema.      Left lower leg: No edema.  Skin:    General: Skin is warm and dry.     Findings: No bruising.  Neurological:     General: No focal deficit present.     Mental Status: She is alert and oriented to person, place, and time.      UC Treatments / Results  Labs (all labs ordered are listed, but only abnormal results are displayed) Labs Reviewed - No data to display  EKG   Radiology No results found.  Procedures Procedures (including critical care time)  Medications Ordered in UC Medications  alum & mag hydroxide-simeth (MAALOX/MYLANTA) 200-200-20 MG/5ML suspension 30 mL (30 mLs Oral Given 07/08/19 1041)    And  lidocaine (XYLOCAINE) 2 % viscous mouth solution 15 mL (15 mLs Oral Given 07/08/19 1042)    Initial Impression / Assessment and Plan / UC Course  I have reviewed the triage vital signs and the nursing notes.  Pertinent labs & imaging results that were available during my care of the patient were reviewed by me and considered in my medical decision making (see chart for details).     #Constipation #Heartburn #Nausea without vomiting Patient is a 34 year old female presenting with 1 week duration of nausea, constipation and heartburn-like symptoms.  Patient's abdomen is soft and exam mostly benign as she is not overly tender on palpation.  Murphy's was negative and afebrile less likely this is a biliary condition.  Given history likely there is an aspect of overproduction of acid versus reflux.  There is not a strong etiology for her constipation symptoms at this time.  Patient instructed to take PPI twice a day for 2 weeks, MiraLAX bowel regimen discussed and strongly advised that if she does not have a bowel movement in 1 to 2 days that she should go to the emergency department.  Also strongly discussed the emergency department precautions for worsening abdominal pain or development of fever. -Zofran for symptomatic care for nausea though I believe this this is related to acid  production. -Patient understands the plan and agrees to symptomatic treatment with strict return precautions or emergency department reporting Final Clinical Impressions(s) / UC Diagnoses   Final diagnoses:  Nausea without vomiting  Constipation, unspecified constipation type  Heart burn     Discharge Instructions     Begin taking the Prilosec twice a day and continue this for 2 weeks.  Like for you to follow-up with primary care for reevaluation of your abdominal discomfort and heartburn-like symptoms in 2 to 4 weeks.  Take the Zofran every 8 hours for nausea.  I want you to take 3 capfuls of MiraLAX in a beverage of your choice when you get home.  If you have not had a bowel movement in the 3 to 4 hours take an additional Every 2-3 hours until you have a bowel movement.  And then I would like for you to take 1 capful daily MiraLAX until you have follow-up.  If your stool becomes very loose he may stop the MiraLAX.  You continue to not have a bowel movement in the next 1 to 2 days I would like for you to go to the emergency department for further evaluation and imaging.  If you have severe abdominal pain and began vomiting like for you to go to the emergency department as well.       ED Prescriptions    Medication Sig Dispense Auth. Provider   omeprazole (PRILOSEC) 20 MG capsule Take 1 capsule (20 mg total) by mouth 2 (two) times daily before a meal for 14 days. 28 capsule Kylia Grajales, Marguerita Beards, PA-C   polyethylene glycol powder (GLYCOLAX/MIRALAX) 17 GM/SCOOP powder Take 17 g by mouth daily. 255 g Aarion Kittrell, Marguerita Beards, PA-C   ondansetron (ZOFRAN) 4 MG tablet  (Status: Discontinued) Take 1 tablet (4 mg total) by mouth every 8 (  eight) hours as needed for nausea or vomiting. 8 tablet Ireene Ballowe, Veryl Speak, PA-C   ondansetron (ZOFRAN) 4 MG tablet Take 1 tablet (4 mg total) by mouth every 8 (eight) hours as needed for nausea or vomiting. 8 tablet Kripa Foskey, Veryl Speak, PA-C     PDMP not reviewed this encounter.     Hermelinda Medicus, PA-C 07/08/19 1651

## 2019-07-08 NOTE — Discharge Instructions (Addendum)
Begin taking the Prilosec twice a day and continue this for 2 weeks.  Like for you to follow-up with primary care for reevaluation of your abdominal discomfort and heartburn-like symptoms in 2 to 4 weeks.  Take the Zofran every 8 hours for nausea.  I want you to take 3 capfuls of MiraLAX in a beverage of your choice when you get home.  If you have not had a bowel movement in the 3 to 4 hours take an additional Every 2-3 hours until you have a bowel movement.  And then I would like for you to take 1 capful daily MiraLAX until you have follow-up.  If your stool becomes very loose he may stop the MiraLAX.  You continue to not have a bowel movement in the next 1 to 2 days I would like for you to go to the emergency department for further evaluation and imaging.  If you have severe abdominal pain and began vomiting like for you to go to the emergency department as well.

## 2019-07-08 NOTE — ED Triage Notes (Signed)
Pt sts intermittent nausea x 2 weeks; pt sts had tooth pulled last week and thought was from meds but finished meds and still having nausea

## 2019-09-13 DIAGNOSIS — Z1388 Encounter for screening for disorder due to exposure to contaminants: Secondary | ICD-10-CM | POA: Diagnosis not present

## 2019-09-13 DIAGNOSIS — Z114 Encounter for screening for human immunodeficiency virus [HIV]: Secondary | ICD-10-CM | POA: Diagnosis not present

## 2019-09-13 DIAGNOSIS — Z3009 Encounter for other general counseling and advice on contraception: Secondary | ICD-10-CM | POA: Diagnosis not present

## 2019-09-13 DIAGNOSIS — B356 Tinea cruris: Secondary | ICD-10-CM | POA: Diagnosis not present

## 2019-09-13 DIAGNOSIS — Z113 Encounter for screening for infections with a predominantly sexual mode of transmission: Secondary | ICD-10-CM | POA: Diagnosis not present

## 2019-09-13 DIAGNOSIS — N760 Acute vaginitis: Secondary | ICD-10-CM | POA: Diagnosis not present

## 2019-09-13 DIAGNOSIS — Z0389 Encounter for observation for other suspected diseases and conditions ruled out: Secondary | ICD-10-CM | POA: Diagnosis not present

## 2019-10-14 DIAGNOSIS — Z20822 Contact with and (suspected) exposure to covid-19: Secondary | ICD-10-CM | POA: Diagnosis not present

## 2020-08-20 DIAGNOSIS — Z20822 Contact with and (suspected) exposure to covid-19: Secondary | ICD-10-CM | POA: Diagnosis not present

## 2020-10-21 ENCOUNTER — Emergency Department (HOSPITAL_COMMUNITY)
Admission: EM | Admit: 2020-10-21 | Discharge: 2020-10-22 | Disposition: A | Discharge status: Home / Self Care | Payer: Medicaid Other | Attending: Emergency Medicine | Admitting: Emergency Medicine

## 2020-10-21 ENCOUNTER — Emergency Department (HOSPITAL_COMMUNITY): Payer: Medicaid Other

## 2020-10-21 ENCOUNTER — Other Ambulatory Visit: Payer: Self-pay

## 2020-10-21 DIAGNOSIS — M545 Low back pain, unspecified: Secondary | ICD-10-CM | POA: Insufficient documentation

## 2020-10-21 DIAGNOSIS — R519 Headache, unspecified: Secondary | ICD-10-CM | POA: Insufficient documentation

## 2020-10-21 DIAGNOSIS — R0781 Pleurodynia: Secondary | ICD-10-CM | POA: Diagnosis not present

## 2020-10-21 DIAGNOSIS — F17210 Nicotine dependence, cigarettes, uncomplicated: Secondary | ICD-10-CM | POA: Insufficient documentation

## 2020-10-21 DIAGNOSIS — M542 Cervicalgia: Secondary | ICD-10-CM | POA: Diagnosis not present

## 2020-10-21 DIAGNOSIS — Z5321 Procedure and treatment not carried out due to patient leaving prior to being seen by health care provider: Secondary | ICD-10-CM | POA: Insufficient documentation

## 2020-10-21 DIAGNOSIS — Y92410 Unspecified street and highway as the place of occurrence of the external cause: Secondary | ICD-10-CM | POA: Insufficient documentation

## 2020-10-21 DIAGNOSIS — S161XXA Strain of muscle, fascia and tendon at neck level, initial encounter: Secondary | ICD-10-CM | POA: Insufficient documentation

## 2020-10-21 MED ORDER — IBUPROFEN 400 MG PO TABS
600.0000 mg | ORAL_TABLET | Freq: Once | ORAL | Status: AC
  Administered 2020-10-21: 600 mg via ORAL
  Filled 2020-10-21: qty 1

## 2020-10-21 NOTE — ED Notes (Signed)
Called for triage, no response. Children in peds
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
## 2020-10-22 ENCOUNTER — Emergency Department (HOSPITAL_COMMUNITY)
Admission: EM | Admit: 2020-10-22 | Discharge: 2020-10-22 | Disposition: A | Discharge status: Home / Self Care | Payer: Medicaid Other | Source: Home / Self Care | Attending: Emergency Medicine | Admitting: Emergency Medicine

## 2020-10-22 ENCOUNTER — Encounter (HOSPITAL_COMMUNITY): Payer: Self-pay | Admitting: Pharmacy Technician

## 2020-10-22 DIAGNOSIS — M545 Low back pain, unspecified: Secondary | ICD-10-CM | POA: Insufficient documentation

## 2020-10-22 DIAGNOSIS — S161XXA Strain of muscle, fascia and tendon at neck level, initial encounter: Secondary | ICD-10-CM | POA: Insufficient documentation

## 2020-10-22 DIAGNOSIS — F17210 Nicotine dependence, cigarettes, uncomplicated: Secondary | ICD-10-CM | POA: Insufficient documentation

## 2020-10-22 DIAGNOSIS — Y92410 Unspecified street and highway as the place of occurrence of the external cause: Secondary | ICD-10-CM | POA: Insufficient documentation

## 2020-10-22 MED ORDER — CYCLOBENZAPRINE HCL 10 MG PO TABS: 10.0000 mg | ORAL_TABLET | Freq: Two times a day (BID) | ORAL | 0 refills | Status: DC | PRN

## 2020-10-22 MED ORDER — NAPROXEN 375 MG PO TABS: 375.0000 mg | ORAL_TABLET | Freq: Two times a day (BID) | ORAL | 0 refills | Status: DC
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
## 2020-10-22 NOTE — ED Notes (Signed)
Patient verbalizes understanding of discharge instructions. Opportunity for questioning and answers were provided. Arm band removed by staff, patient discharged from ED. 
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
## 2020-10-22 NOTE — ED Notes (Signed)
Called for repeat VS x3, no response. 

## 2020-10-22 NOTE — ED Notes (Signed)
Pt name called for vitals no answer 
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
## 2021-02-01 DIAGNOSIS — Z20822 Contact with and (suspected) exposure to covid-19: Secondary | ICD-10-CM | POA: Diagnosis not present

## 2021-07-26 ENCOUNTER — Encounter (HOSPITAL_COMMUNITY): Payer: Self-pay | Admitting: Emergency Medicine

## 2021-07-26 ENCOUNTER — Ambulatory Visit (HOSPITAL_COMMUNITY)
Admission: EM | Admit: 2021-07-26 | Discharge: 2021-07-26 | Disposition: A | Discharge status: Home / Self Care | Payer: Medicaid Other | Attending: Family Medicine | Admitting: Family Medicine

## 2021-07-26 DIAGNOSIS — J4521 Mild intermittent asthma with (acute) exacerbation: Secondary | ICD-10-CM | POA: Diagnosis not present

## 2021-07-26 MED ORDER — ALBUTEROL SULFATE HFA 108 (90 BASE) MCG/ACT IN AERS: 1.0000 | INHALATION_SPRAY | RESPIRATORY_TRACT | 0 refills | Status: DC | PRN

## 2021-07-26 MED ORDER — TRIAMCINOLONE ACETONIDE 40 MG/ML IJ SUSP
INTRAMUSCULAR | Status: AC
  Filled 2021-07-26: qty 1

## 2021-07-26 MED ORDER — MONTELUKAST SODIUM 10 MG PO TABS: 10.0000 mg | ORAL_TABLET | Freq: Every day | ORAL | 2 refills | Status: DC

## 2021-07-26 MED ORDER — TRIAMCINOLONE ACETONIDE 40 MG/ML IJ SUSP
40.0000 mg | Freq: Once | INTRAMUSCULAR | Status: AC
  Administered 2021-07-26: 40 mg via INTRAMUSCULAR

## 2021-07-26 MED ORDER — PREDNISONE 20 MG PO TABS: 40.0000 mg | ORAL_TABLET | Freq: Every day | ORAL | 0 refills | Status: AC

## 2021-07-26 NOTE — Discharge Instructions (Addendum)
You have been given a shot of triamcinolone 40 mg ? ?Take prednisone 20 mg--2 daily for 5 days ? ?Take montelukast 10 mg--1 every evening at bedtime for allergies and asthma ? ?Take albuterol 2 puffs every 4 hours as needed for shortness of breath or wheezing. ?

## 2021-07-26 NOTE — ED Provider Notes (Addendum)
?[facility] ? ? ? ?CSN: [id number] ?Arrival date & time: 07/26/21  1407 ? ? ?  ? ?History   ?Chief Complaint ?No chief complaint on file. ? ? ?HPI ?Brooke Christian is a 36 y.o. female.  ? ?HPI ?Here for a 1 day history of chest tightness, wheezing, eyelid swelling, and congestion.  She has also had some itching in her throat.  No fever or chills.  Vomiting or diarrhea. ? ?History reviewed. No pertinent past medical history. ? ?There are no problems to display for this patient. ? ? ?Past Surgical History:  ?Procedure Laterality Date  ? TUBAL LIGATION    ? ? ?OB History   ?No obstetric history on file. ?  ? ? ? ?Home Medications   ? ?Prior to Admission medications   ?Medication Sig Start Date End Date Taking? Authorizing Provider  ?albuterol (VENTOLIN HFA) 108 (90 Base) MCG/ACT inhaler Inhale 1-2 puffs into the lungs every 4 (four) hours as needed for wheezing or shortness of breath. 07/26/21  Yes Courtland Coppa, Janace Aris, MD  ?montelukast (SINGULAIR) 10 MG tablet Take 1 tablet (10 mg total) by mouth at bedtime. 07/26/21  Yes Zenia Resides, MD  ?predniSONE (DELTASONE) 20 MG tablet Take 2 tablets (40 mg total) by mouth daily with breakfast for 5 days. 07/26/21 07/31/21 Yes Zenia Resides, MD  ? ? ?Family History ?Family History  ?Problem Relation Age of Onset  ? Healthy Mother   ? Healthy Father   ? ? ?Social History ?Social History  ? ?Tobacco Use  ? Smoking status: Every Day  ?  Types: Cigarettes  ? Smokeless tobacco: Never  ?Substance Use Topics  ? Alcohol use: No  ? Drug use: No  ? ? ? ?Allergies   ?Patient has no known allergies. ? ? ?Review of Systems ?Review of Systems ? ? ?Physical Exam ?Triage Vital Signs ?ED Triage Vitals [07/26/21 1539]  ?Enc Vitals Group  ?   BP (!) 158/85  ?   Pulse Rate 72  ?   Resp 17  ?   Temp 98.5 ?F (36.9 ?C)  ?   Temp Source Oral  ?   SpO2 98 %  ?   Weight   ?   Height   ?   Head Circumference   ?   Peak Flow   ?   Pain Score   ?   Pain Loc   ?   Pain Edu?   ?   Excl. in GC?    ? ?No data found. ? ?Updated Vital Signs ?BP (!) 158/85 (BP Location: Left Arm)   Pulse 72   Temp 98.5 ?F (36.9 ?C) (Oral)   Resp 17   LMP 07/18/2021   SpO2 98%  ? ?Visual Acuity ?Right Eye Distance:   ?Left Eye Distance:   ?Bilateral Distance:   ? ?Right Eye Near:   ?Left Eye Near:    ?Bilateral Near:    ? ?Physical Exam ?Vitals reviewed.  ?Constitutional:   ?   General: She is not in acute distress. ?   Appearance: She is not toxic-appearing.  ?HENT:  ?   Right Ear: Tympanic membrane and ear canal normal.  ?   Left Ear: Tympanic membrane and ear canal normal.  ?   Nose: Nose normal.  ?   Mouth/Throat:  ?   Mouth: Mucous membranes are moist.  ?   Pharynx: No oropharyngeal exudate or posterior oropharyngeal erythema.  ?Eyes:  ?   Extraocular Movements: Extraocular  movements intact.  ?   Conjunctiva/sclera: Conjunctivae normal.  ?   Pupils: Pupils are equal, round, and reactive to light.  ?   Comments: Upper eyelids are mildly swollen as are the lower ones.  No injection of the conjunctiva.  ?Cardiovascular:  ?   Rate and Rhythm: Normal rate and regular rhythm.  ?   Heart sounds: No murmur heard. ?Pulmonary:  ?   Effort: Pulmonary effort is normal. No respiratory distress.  ?   Breath sounds: No wheezing, rhonchi or rales.  ?Musculoskeletal:  ?   Cervical back: Neck supple.  ?Lymphadenopathy:  ?   Cervical: No cervical adenopathy.  ?Skin: ?   Capillary Refill: Capillary refill takes less than 2 seconds.  ?   Coloration: Skin is not jaundiced or pale.  ?Neurological:  ?   General: No focal deficit present.  ?   Mental Status: She is alert and oriented to person, place, and time.  ?Psychiatric:     ?   Behavior: Behavior normal.  ? ? ? ?UC Treatments / Results  ?Labs ?(all labs ordered are listed, but only abnormal results are displayed) ?Labs Reviewed - No data to display ? ?EKG ? ? ?Radiology ?No results found. ? ?Procedures ?Procedures (including critical care time) ? ?Medications Ordered in UC ?Medications   ?triamcinolone acetonide (KENALOG-40) injection 40 mg (has no administration in time range)  ? ? ?Initial Impression / Assessment and Plan / UC Course  ?I have reviewed the triage vital signs and the nursing notes. ? ?Pertinent labs & imaging results that were available during my care of the patient were reviewed by me and considered in my medical decision making (see chart for details). ? ?  ? ?Will treat for asthma exacerbation and allergies. Request made to help her find a pcp. ?Final Clinical Impressions(s) / UC Diagnoses  ? ?Final diagnoses:  ?Mild intermittent asthma with acute exacerbation  ? ? ? ?Discharge Instructions   ? ?  ?You have been given a shot of triamcinolone 40 mg ? ?Take prednisone 20 mg--2 daily for 5 days ? ?Take montelukast 10 mg--1 every evening at bedtime for allergies and asthma ? ?Take albuterol 2 puffs every 4 hours as needed for shortness of breath or wheezing. ? ? ? ? ?ED Prescriptions   ? ? Medication Sig Dispense Auth. Provider  ? albuterol (VENTOLIN HFA) 108 (90 Base) MCG/ACT inhaler Inhale 1-2 puffs into the lungs every 4 (four) hours as needed for wheezing or shortness of breath. 1 each Zenia Resides, MD  ? predniSONE (DELTASONE) 20 MG tablet Take 2 tablets (40 mg total) by mouth daily with breakfast for 5 days. 10 tablet Zenia Resides, MD  ? montelukast (SINGULAIR) 10 MG tablet Take 1 tablet (10 mg total) by mouth at bedtime. 30 tablet Zenia Resides, MD  ? ?  ? ?PDMP not reviewed this encounter. ?  ?Zenia Resides, MD ?07/26/21 1613 ? ?  ?Zenia Resides, MD ?07/26/21 1614 ? ?

## 2021-07-26 NOTE — ED Triage Notes (Signed)
Pt reports her allergies are bothering her. Pt c/o congestion, eye swelling, wheezing when woke up today.  ?

## 2021-11-08 ENCOUNTER — Encounter (HOSPITAL_COMMUNITY): Payer: Self-pay

## 2021-11-08 ENCOUNTER — Ambulatory Visit (HOSPITAL_COMMUNITY)
Admission: EM | Admit: 2021-11-08 | Discharge: 2021-11-08 | Disposition: A | Discharge status: Home / Self Care | Payer: Medicaid Other | Attending: Physician Assistant | Admitting: Physician Assistant

## 2021-11-08 DIAGNOSIS — G43829 Menstrual migraine, not intractable, without status migrainosus: Secondary | ICD-10-CM | POA: Diagnosis not present

## 2021-11-08 MED ORDER — ONDANSETRON 4 MG PO TBDP
ORAL_TABLET | ORAL | Status: AC
  Filled 2021-11-08: qty 1

## 2021-11-08 MED ORDER — ONDANSETRON 4 MG PO TBDP
4.0000 mg | ORAL_TABLET | Freq: Once | ORAL | Status: AC
  Administered 2021-11-08: 4 mg via ORAL

## 2021-11-08 MED ORDER — KETOROLAC TROMETHAMINE 30 MG/ML IJ SOLN
INTRAMUSCULAR | Status: AC
  Filled 2021-11-08: qty 1

## 2021-11-08 MED ORDER — KETOROLAC TROMETHAMINE 30 MG/ML IJ SOLN
30.0000 mg | Freq: Once | INTRAMUSCULAR | Status: AC
  Administered 2021-11-08: 30 mg via INTRAMUSCULAR

## 2021-11-08 MED ORDER — ONDANSETRON 4 MG PO TBDP: 4.0000 mg | ORAL_TABLET | Freq: Three times a day (TID) | ORAL | 0 refills | Status: DC | PRN

## 2021-11-08 MED ORDER — BACLOFEN 10 MG PO TABS: 10.0000 mg | ORAL_TABLET | Freq: Two times a day (BID) | ORAL | 0 refills | Status: DC | PRN

## 2021-11-08 NOTE — Discharge Instructions (Signed)
You should not take any NSAIDs for 12 hours since we gave you Toradol injection today which includes aspirin, ibuprofen/Advil, naproxen/Aleve.  You can use Tylenol as needed.  Take baclofen up to twice a day.  This can make you sleepy so do not drive or drink alcohol taking this medication.  Use Zofran up to every 8 hours as needed for nausea and vomiting.  Make sure you are drinking plenty of fluid and resting.  If you continue to have recurrent headaches I do recommend you follow-up with neurology; call to schedule an appointment.  We will try to establish with a primary care provider (someone to reach out to you).  If you have any worsening symptoms including severe headache, vision change, nausea/vomiting interfere with oral intake, weakness, dizziness, difficulty speaking you need to go to the emergency room immediately.

## 2021-11-08 NOTE — ED Provider Notes (Signed)
[facility]    CSN: [id number] Arrival date & time: 11/08/21  1001      History   Chief Complaint Chief Complaint  Patient presents with   Migraine    HPI Brooke Christian is a 36 y.o. female.   Patient presents today with a several day history of severe right-sided migraine.  Reports that pain has mildly improved since onset but is currently rated 8 on a 0-10 pain scale, localized to right eye with radiation into neck, described as throbbing, worse with activity, no alleviating factors identified.  She does report associated nausea and vomiting with last episode of emesis several days ago.  She has been able to eat and drink without difficulty.  She reports a history of migraines several years ago but had not had them for a while until the past several months where her menstrual cycle will trigger the headache at around a 3 or 4.  She denies any recent injury or medication changes.  Denies any recent illness or additional symptoms including cough, congestion, fever.  She has tried Excedrin with only temporary relief of symptoms.  She has missed work as result of symptoms.  She has not seen a neurologist or taking prophylactic medications.  This is not the worst headache of her life.  Denies any focal weakness, dysarthria, visual disturbance, dizziness.  She is, she is not pregnant she is currently on her menstrual cycle and has a history of tubal ligation.    History reviewed. No pertinent past medical history.  There are no problems to display for this patient.   Past Surgical History:  Procedure Laterality Date   TUBAL LIGATION      OB History   No obstetric history on file.      Home Medications    Prior to Admission medications   Medication Sig Start Date End Date Taking? Authorizing Provider  baclofen (LIORESAL) 10 MG tablet Take 1 tablet (10 mg total) by mouth 2 (two) times daily as needed for muscle spasms. 11/08/21  Yes Jervon Ream K, PA-C  ondansetron  (ZOFRAN-ODT) 4 MG disintegrating tablet Take 1 tablet (4 mg total) by mouth every 8 (eight) hours as needed for nausea or vomiting. 11/08/21  Yes Meili Kleckley K, PA-C  albuterol (VENTOLIN HFA) 108 (90 Base) MCG/ACT inhaler Inhale 1-2 puffs into the lungs every 4 (four) hours as needed for wheezing or shortness of breath. 07/26/21   Banister, Janace Aris, MD  montelukast (SINGULAIR) 10 MG tablet Take 1 tablet (10 mg total) by mouth at bedtime. 07/26/21   Zenia Resides, MD    Family History Family History  Problem Relation Age of Onset   Healthy Mother    Healthy Father     Social History Social History   Tobacco Use   Smoking status: Every Day    Types: Cigarettes   Smokeless tobacco: Never  Substance Use Topics   Alcohol use: No   Drug use: No     Allergies   Patient has no known allergies.   Review of Systems Review of Systems  Constitutional:  Positive for activity change and fatigue. Negative for appetite change and fever.  HENT:  Negative for congestion, sinus pressure, sneezing and sore throat.   Eyes:  Negative for photophobia and visual disturbance.  Respiratory:  Negative for cough.   Cardiovascular:  Negative for chest pain.  Gastrointestinal:  Positive for nausea and vomiting. Negative for abdominal pain and diarrhea.  Neurological:  Positive for headaches.  Negative for dizziness, syncope, speech difficulty, weakness, light-headedness and numbness.     Physical Exam Triage Vital Signs ED Triage Vitals [11/08/21 1032]  Enc Vitals Group     BP (!) 150/90     Pulse Rate 76     Resp 18     Temp 98.7 F (37.1 C)     Temp Source Oral     SpO2 99 %     Weight      Height      Head Circumference      Peak Flow      Pain Score      Pain Loc      Pain Edu?      Excl. in GC?    No data found.  Updated Vital Signs BP (!) 150/90 (BP Location: Left Arm)   Pulse 76   Temp 98.7 F (37.1 C) (Oral)   Resp 18   LMP 11/02/2021   SpO2 99%   Visual  Acuity Right Eye Distance:   Left Eye Distance:   Bilateral Distance:    Right Eye Near:   Left Eye Near:    Bilateral Near:     Physical Exam Vitals reviewed.  Constitutional:      General: She is awake. She is not in acute distress.    Appearance: Normal appearance. She is well-developed. She is not ill-appearing.     Comments: Very pleasant female appears stated age in no acute distress sitting comfortably in exam room  HENT:     Head: Normocephalic and atraumatic. No raccoon eyes, Battle's sign or contusion.     Right Ear: Tympanic membrane, ear canal and external ear normal. No hemotympanum.     Left Ear: Tympanic membrane, ear canal and external ear normal. No hemotympanum.     Mouth/Throat:     Tongue: Tongue does not deviate from midline.     Pharynx: Uvula midline. No oropharyngeal exudate or posterior oropharyngeal erythema.  Eyes:     Extraocular Movements: Extraocular movements intact.     Conjunctiva/sclera: Conjunctivae normal.     Pupils: Pupils are equal, round, and reactive to light.  Cardiovascular:     Rate and Rhythm: Normal rate and regular rhythm.     Heart sounds: Normal heart sounds, S1 normal and S2 normal. No murmur heard. Pulmonary:     Effort: Pulmonary effort is normal.     Breath sounds: Normal breath sounds. No wheezing, rhonchi or rales.     Comments: Clear to auscultation bilaterally Abdominal:     General: Bowel sounds are normal.     Palpations: Abdomen is soft.     Tenderness: There is no abdominal tenderness.  Musculoskeletal:     Cervical back: Normal range of motion and neck supple. No spinous process tenderness or muscular tenderness.     Comments: Strength 5/5 bilateral upper and lower extremities  Lymphadenopathy:     Head:     Right side of head: No submental, submandibular or tonsillar adenopathy.     Left side of head: No submental, submandibular or tonsillar adenopathy.  Neurological:     General: No focal deficit present.      Mental Status: She is alert and oriented to person, place, and time.     Cranial Nerves: Cranial nerves 2-12 are intact.     Motor: Motor function is intact.     Coordination: Coordination is intact. Romberg sign negative.     Gait: Gait is intact.  Psychiatric:  Behavior: Behavior is cooperative.      UC Treatments / Results  Labs (all labs ordered are listed, but only abnormal results are displayed) Labs Reviewed - No data to display  EKG   Radiology No results found.  Procedures Procedures (including critical care time)  Medications Ordered in UC Medications  ketorolac (TORADOL) 30 MG/ML injection 30 mg (30 mg Intramuscular Given 11/08/21 1057)  ondansetron (ZOFRAN-ODT) disintegrating tablet 4 mg (4 mg Oral Given 11/08/21 1057)    Initial Impression / Assessment and Plan / UC Course  I have reviewed the triage vital signs and the nursing notes.  Pertinent labs & imaging results that were available during my care of the patient were reviewed by me and considered in my medical decision making (see chart for details).     Vital signs and physical exam reassuring today; no indication for emergent evaluation or imaging.  Patient was given Toradol in clinic as well as Zofran with significant improvement of symptoms.  Discussed that symptoms are likely related to menstrual migraine.  She was instructed not to take NSAIDs for an additional 12 hours given Toradol injection today but can use acetaminophen/Tylenol for symptom relief.  She was started on baclofen for additional symptom relief with instruction not to drive or drink alcohol while taking this medication.  Prescription for Zofran was sent to pharmacy to be used as needed for nausea and vomiting.  She is to drink plenty fluid, rest, eat small frequent meals for symptom relief.  Discussed that if her symptoms continue to be bothersome she should follow-up with a neurologist and was given contact information for local  provider with instruction to call to schedule an appointment.  She does not have a primary care provider so we will try to establish her with someone via PCP assistance.  Discussed that if she has any worsening symptoms including severe headache, worst headache of her life, dizziness, nausea/vomiting interfere with oral intake, focal weakness, dysarthria, visual disturbance she needs to go to the emergency room immediately to which she expressed understanding.  Strict return precautions given.  Work excuse note provided.  Final Clinical Impressions(s) / UC Diagnoses   Final diagnoses:  Menstrual migraine without status migrainosus, not intractable     Discharge Instructions      You should not take any NSAIDs for 12 hours since we gave you Toradol injection today which includes aspirin, ibuprofen/Advil, naproxen/Aleve.  You can use Tylenol as needed.  Take baclofen up to twice a day.  This can make you sleepy so do not drive or drink alcohol taking this medication.  Use Zofran up to every 8 hours as needed for nausea and vomiting.  Make sure you are drinking plenty of fluid and resting.  If you continue to have recurrent headaches I do recommend you follow-up with neurology; call to schedule an appointment.  We will try to establish with a primary care provider (someone to reach out to you).  If you have any worsening symptoms including severe headache, vision change, nausea/vomiting interfere with oral intake, weakness, dizziness, difficulty speaking you need to go to the emergency room immediately.     ED Prescriptions     Medication Sig Dispense Auth. Provider   ondansetron (ZOFRAN-ODT) 4 MG disintegrating tablet Take 1 tablet (4 mg total) by mouth every 8 (eight) hours as needed for nausea or vomiting. 20 tablet Dazani Norby K, PA-C   baclofen (LIORESAL) 10 MG tablet Take 1 tablet (10 mg total) by mouth 2 (  two) times daily as needed for muscle spasms. 30 each Randol Zumstein, Noberto Retort, PA-C       PDMP not reviewed this encounter.   Jeani Hawking, PA-C 11/08/21 1133

## 2021-11-08 NOTE — ED Triage Notes (Signed)
Pt states hx of migraines during her menstrual cycles for the past 3 months. C/o headache/nausea/vomiting/diarrhea since Friday. States took Excedrin on Saturday with relief. States needs work note for today.

## 2021-11-18 ENCOUNTER — Encounter: Payer: Self-pay | Admitting: Psychiatry

## 2021-11-18 ENCOUNTER — Telehealth: Payer: Self-pay | Admitting: Psychiatry

## 2021-11-18 ENCOUNTER — Ambulatory Visit: Payer: Medicaid Other | Admitting: Psychiatry

## 2021-11-18 VITALS — BP 112/74 | HR 78 | Ht 68.0 in | Wt 166.0 lb

## 2021-11-18 DIAGNOSIS — R519 Headache, unspecified: Secondary | ICD-10-CM

## 2021-11-18 DIAGNOSIS — G43829 Menstrual migraine, not intractable, without status migrainosus: Secondary | ICD-10-CM | POA: Diagnosis not present

## 2021-11-18 DIAGNOSIS — R29818 Other symptoms and signs involving the nervous system: Secondary | ICD-10-CM | POA: Diagnosis not present

## 2021-11-18 MED ORDER — NARATRIPTAN HCL 2.5 MG PO TABS: 2.5000 mg | ORAL_TABLET | ORAL | 6 refills | Status: DC | PRN

## 2021-11-18 NOTE — Progress Notes (Signed)
Referring:  Jeani Hawking, PA-C [address] [place],  [place] [address]  PCP: Patient, No Pcp Per  Neurology was asked to evaluate Brooke Christian, a 36 year old female for a chief complaint of headaches.  Our recommendations of care will be communicated by shared medical record.    CC:  headaches  History provided from self  HPI:  Medical co-morbidities: asthma  The patient presents for evaluation of headaches which began in April 2023. Headaches are described retro-orbital and occipital pain with associated photophobia, phonophobia, nausea, and vomiting. They can last 3-4 days at a time. Headaches exclusively occur during her menstrual cycle. She takes Excedrin as needed but this is generally not effective.   Headache History: Onset: April 2023 Triggers: menstrual cycle Aura: no Location: retro-orbital, occiput Associated Symptoms:  Photophobia: yes  Phonophobia: yes  Nausea: yes Vomiting: yes Worse with activity?: yes Duration of headaches: 3-4 days  Headache days per month: 4 Headache free days per month: 26  Current Treatment: Abortive Excedrin  Preventative none  Prior Therapies                                 Baclofen Zofran Excedrin toradol    LABS: CBC    Component Value Date/Time   WBC 11.8 (H) 12/16/2009 0521   RBC 3.18 (L) 12/16/2009 0521   HGB 10.9 (L) 12/16/2009 0521   HCT 30.7 (L) 12/16/2009 0521   PLT 324 12/16/2009 0521   MCV 96.5 12/16/2009 0521   MCH 34.2 (H) 12/16/2009 0521   MCHC 35.4 12/16/2009 0521   RDW 14.0 12/16/2009 0521      08/15/2007    4:28 PM  CMP  Glucose 83   BUN <3   Creatinine 0.5   Sodium 137   Potassium 3.6   Chloride 105      IMAGING:  none   Current Outpatient Medications on File Prior to Visit  Medication Sig Dispense Refill   baclofen (LIORESAL) 10 MG tablet Take 1 tablet (10 mg total) by mouth 2 (two) times daily as needed for muscle spasms. 30 each 0   ondansetron (ZOFRAN-ODT) 4 MG  disintegrating tablet Take 1 tablet (4 mg total) by mouth every 8 (eight) hours as needed for nausea or vomiting. 20 tablet 0   albuterol (VENTOLIN HFA) 108 (90 Base) MCG/ACT inhaler Inhale 1-2 puffs into the lungs every 4 (four) hours as needed for wheezing or shortness of breath. (Patient not taking: Reported on 11/18/2021) 1 each 0   montelukast (SINGULAIR) 10 MG tablet Take 1 tablet (10 mg total) by mouth at bedtime. (Patient not taking: Reported on 11/18/2021) 30 tablet 2   No current facility-administered medications on file prior to visit.     Allergies: Allergies  Allergen Reactions   Naproxen Nausea And Vomiting    Family History: Migraine or other headaches in the family:  no Aneurysms in a first degree relative:  no Brain tumors in the family:  no Other neurological illness in the family:   no  Past Medical History: History reviewed. No pertinent past medical history.  Past Surgical History Past Surgical History:  Procedure Laterality Date   TUBAL LIGATION      Social History: Social History   Tobacco Use   Smoking status: Every Day    Types: Cigarettes   Smokeless tobacco: Never  Substance Use Topics   Alcohol use: No   Drug use: No  ROS: Negative for fevers, chills. Positive for headaches. All other systems reviewed and negative unless stated otherwise in HPI.   Physical Exam:   Vital Signs: BP 112/74   Pulse 78   Ht 5\' 8"  (1.727 m)   Wt 166 lb (75.3 kg)   LMP 11/02/2021   BMI 25.24 kg/m  GENERAL: well appearing,in no acute distress,alert SKIN:  Color, texture, turgor normal. No rashes or lesions HEAD:  Normocephalic/atraumatic. CV:  RRR RESP: Normal respiratory effort MSK: no tenderness to palpation over occiput, neck, or shoulders  NEUROLOGICAL: Mental Status: Alert, oriented to person, place and time,Follows commands Cranial Nerves: PERRL, visual fields intact to confrontation, extraocular movements intact, decreased sensation over  left V2, no facial droop or ptosis, hearing grossly intact, no dysarthria Motor: muscle strength 5/5 both upper and lower extremities,no drift, normal tone Reflexes: 2+ throughout Sensation: decreased sensation to light touch over LLE Coordination: Finger-to- nose-finger intact bilaterally,Heel-to-shin intact bilaterally Gait: normal-based   IMPRESSION: 36 year old female with a history of asthma who presents for evaluation of worsening headaches. Will order brain MRI as exam is significant for decreased sensation over left face and leg. Her headache pattern is most consistent with menstrual migraine. Will start naratriptan for rescue. Counseled that this can be taken both as needed and as mini-prevention during her cycle.  PLAN: -MRI brain -Start naratriptan 2.5 mg as needed for migraine rescue. Can take 1/2 pill BID during cycle for mini-migraine prevention.   I spent a total of 19 minutes chart reviewing and counseling the patient. Headache education was done. Discussed treatment options including acute medications. Discussed medication side effects, adverse reactions and drug interactions. Written educational materials and patient instructions outlining all of the above were given.  Follow-up: 6 months   20, MD 11/18/2021   11:47 AM

## 2021-11-18 NOTE — Telephone Encounter (Signed)
[employer] medicaid Berkley Harvey: [id number] exp. 11/18/21-01/02/22 sent to [facility]

## 2021-11-18 NOTE — Patient Instructions (Addendum)
Start naratriptan as needed for migraines. Take one pill at the onset of migraine. Can repeat a dose in four hours if headache persist. Max dose 2 pills in 24 hours.  Can also take 1/2 pill of naratriptan twice a day during your menstrual cycle to prevent migraines. Start at the first day of your period and stop when your period ends

## 2021-11-22 ENCOUNTER — Telehealth: Payer: Self-pay | Admitting: *Deleted

## 2021-11-22 ENCOUNTER — Other Ambulatory Visit: Payer: Self-pay | Admitting: Psychiatry

## 2021-11-22 MED ORDER — RIZATRIPTAN BENZOATE 10 MG PO TABS: 10.0000 mg | ORAL_TABLET | ORAL | 6 refills | Status: DC | PRN

## 2021-11-22 NOTE — Telephone Encounter (Signed)
Rx for rizatriptan sent. She can take this as needed for her migraines instead of the naratriptan

## 2021-11-22 NOTE — Telephone Encounter (Signed)
Called patient and advised her of naratriptan denial, rizatriptan ordered. Gave directions for use. Patient verbalized understanding, appreciation.

## 2021-11-22 NOTE — Telephone Encounter (Signed)
Naratriptan PA, Key: [id number]  Your information has been sent to [employer].

## 2021-11-22 NOTE — Telephone Encounter (Signed)
Naratriptan denied. Per your health plan's criteria, this drug is covered if you meet the following: For a non-preferred drug, you have tried two preferred drugs: rizatriptan orally disintegrating tablet (generic for Maxalt MLT), rizatriptan tablet (generic for Maxalt), sumatriptan nasal spray, tablet, and vial (generic for Imitrex). Sent to [facility].

## 2021-11-30 ENCOUNTER — Ambulatory Visit
Admit: 2021-11-30 | Discharge: 2021-11-30 | Disposition: A | Discharge status: Home / Self Care | Payer: Medicaid Other | Attending: Psychiatry | Admitting: Psychiatry

## 2021-11-30 DIAGNOSIS — R519 Headache, unspecified: Secondary | ICD-10-CM | POA: Diagnosis not present

## 2021-11-30 DIAGNOSIS — R29818 Other symptoms and signs involving the nervous system: Secondary | ICD-10-CM | POA: Diagnosis not present

## 2021-11-30 MED ORDER — GADOBENATE DIMEGLUMINE 529 MG/ML IV SOLN
15.0000 mL | Freq: Once | INTRAVENOUS | Status: AC | PRN
  Administered 2021-11-30: 15 mL via INTRAVENOUS

## 2022-01-10 ENCOUNTER — Encounter (INDEPENDENT_AMBULATORY_CARE_PROVIDER_SITE_OTHER): Payer: Self-pay | Admitting: Primary Care

## 2022-01-10 ENCOUNTER — Ambulatory Visit: Payer: Medicaid Other | Attending: Primary Care

## 2022-01-10 ENCOUNTER — Ambulatory Visit (INDEPENDENT_AMBULATORY_CARE_PROVIDER_SITE_OTHER): Payer: Medicaid Other | Admitting: Primary Care

## 2022-01-10 VITALS — BP 116/78 | HR 64 | Resp 16 | Ht 68.0 in | Wt 166.2 lb

## 2022-01-10 DIAGNOSIS — Z7689 Persons encountering health services in other specified circumstances: Secondary | ICD-10-CM | POA: Diagnosis not present

## 2022-01-10 DIAGNOSIS — Z0000 Encounter for general adult medical examination without abnormal findings: Secondary | ICD-10-CM | POA: Diagnosis not present

## 2022-01-10 DIAGNOSIS — R634 Abnormal weight loss: Secondary | ICD-10-CM | POA: Diagnosis not present

## 2022-01-10 DIAGNOSIS — N921 Excessive and frequent menstruation with irregular cycle: Secondary | ICD-10-CM | POA: Diagnosis not present

## 2022-01-10 DIAGNOSIS — R5383 Other fatigue: Secondary | ICD-10-CM | POA: Diagnosis not present

## 2022-01-10 DIAGNOSIS — G43101 Migraine with aura, not intractable, with status migrainosus: Secondary | ICD-10-CM

## 2022-01-10 NOTE — Progress Notes (Signed)
New Patient Office Visit  Subjective    Patient ID: Brooke Christian, female    DOB: 01-23-1986  Age: 36 y.o. MRN: [id number]  CC:  Chief Complaint  Patient presents with   New Patient (Initial Visit)   Menstrual Problem   Migraine    Started 4 months ago when her cycles comes on  Pain is 10 out of 10     HPI Brooke Christian is a 36 year old normal BMI who presents to establish care.  She voices her major concerns she has migraines that worsens when she is on her menstrual cycle and she is followed by neurology for migraines.  Menorrhalgia started approximately 4 months ago causing severe pain and irregular cycles.  She does admit to being more fatigued than normal.  She also mention gaining weight without any changes in her eating or physical activities.  Patient has  No chest pain, No abdominal pain - No Nausea, No new weakness tingling or numbness, No Cough - shortness of breath    Outpatient Encounter Medications as of 01/10/2022  Medication Sig   baclofen (LIORESAL) 10 MG tablet Take 1 tablet (10 mg total) by mouth 2 (two) times daily as needed for muscle spasms.   ondansetron (ZOFRAN-ODT) 4 MG disintegrating tablet Take 1 tablet (4 mg total) by mouth every 8 (eight) hours as needed for nausea or vomiting.   rizatriptan (MAXALT) 10 MG tablet Take 1 tablet (10 mg total) by mouth as needed for migraine. May repeat in 2 hours if needed   albuterol (VENTOLIN HFA) 108 (90 Base) MCG/ACT inhaler Inhale 1-2 puffs into the lungs every 4 (four) hours as needed for wheezing or shortness of breath. (Patient not taking: Reported on 11/18/2021)   montelukast (SINGULAIR) 10 MG tablet Take 1 tablet (10 mg total) by mouth at bedtime. (Patient not taking: Reported on 11/18/2021)   No facility-administered encounter medications on file as of 01/10/2022.    History reviewed. No pertinent past medical history.  Past Surgical History:  Procedure Laterality Date   TUBAL LIGATION      Family  History  Problem Relation Age of Onset   Healthy Mother    Healthy Father     Social History   Socioeconomic History   Marital status: Single    Spouse name: Not on file   Number of children: Not on file   Years of education: Not on file   Highest education level: Not on file  Occupational History   Not on file  Tobacco Use   Smoking status: Every Day    Types: Cigarettes   Smokeless tobacco: Never  Vaping Use   Vaping Use: Never used  Substance and Sexual Activity   Alcohol use: No   Drug use: Yes    Types: Marijuana   Sexual activity: Yes    Birth control/protection: Surgical  Other Topics Concern   Not on file  Social History Narrative   Not on file   Social Determinants of Health   Financial Resource Strain: Not on file  Food Insecurity: Not on file  Transportation Needs: Not on file  Physical Activity: Not on file  Stress: Not on file  Social Connections: Not on file  Intimate Partner Violence: Not on file    ROS Comprehensive ROS Pertinent positive and negative noted in HPI     Objective    BP 116/78   Pulse 64   Resp 16   Ht 5' 8"  (1.727 m)   Wt 166  lb 3.2 oz (75.4 kg)   LMP 12/14/2021   SpO2 100%   BMI 25.27 kg/m   Physical exam: General: Vital signs reviewed.  Patient is well-developed and well-nourished, normal BMI in no acute distress and cooperative with exam. Head: Normocephalic and atraumatic. Eyes: EOMI, conjunctivae normal, no scleral icterus. Neck: Supple, trachea midline, normal ROM, no JVD, masses, thyromegaly, or carotid bruit present. Cardiovascular: RRR, S1 normal, S2 normal, no murmurs, gallops, or rubs. Pulmonary/Chest: Clear to auscultation bilaterally, no wheezes, rales, or rhonchi. Abdominal: Soft, non-tender, non-distended, BS +, no masses, organomegaly, or guarding present. Musculoskeletal: No joint deformities, erythema, or stiffness, ROM full and nontender. Extremities: No lower extremity edema bilaterally,  pulses  symmetric and intact bilaterally. No cyanosis or clubbing. Neurological: A&O x3, Strength is normal Skin: Warm, dry and intact. No rashes or erythema. Psychiatric: Normal mood and affect. speech and behavior is normal. Cognition and memory are normal.    Last CBC Lab Results  Component Value Date   WBC 11.8 (H) 12/16/2009   HGB 10.9 (L) 12/16/2009   HCT 30.7 (L) 12/16/2009   MCV 96.5 12/16/2009   MCH 34.2 (H) 12/16/2009   RDW 14.0 12/16/2009   PLT 324 32/04/2481   Last metabolic panel Lab Results  Component Value Date   GLUCOSE 83 08/15/2007   NA 137 08/15/2007   K 3.6 08/15/2007   CL 105 08/15/2007   BUN <3 (L) 08/15/2007   CREATININE 0.5 08/15/2007   Last thyroid functions No results found for: "TSH", "T3TOTAL", "T4TOTAL", "THYROIDAB"      Assessment & Plan:  [name] was seen today for new patient (initial visit), menstrual problem and migraine.  Diagnoses and all orders for this visit:  Encounter to establish care Establish care with PCP -     CMP14+EGFR  Unintentional weight loss Differential diagnosis thyroid problems -     TSH + free T4 -     CBC with Differential  Menorrhagia with irregular cycle Referring to GYN painful irregular menstrual cycles also impedes activities because proceeds migraines -     Ambulatory referral to Gynecology -     CBC with Differential -     CMP14+EGFR  Migraine with aura and with status migrainosus, not intractable Migraines is managed by neurology -     CMP14+EGFR  Fatigue, unspecified type Rule out causative fatigue -     TSH + free T4 -     CBC with Differential    No follow-ups on file.   Kerin Perna, NP

## 2022-01-10 NOTE — Patient Instructions (Signed)
Menorrhagia Menorrhagia is when your monthly periods are heavy or last longer than normal. If you have this condition, bleeding and cramping may make it hard for you to do your daily activities. What are the causes? Common causes of this condition include: Growths in the womb (uterus). These are polyps or fibroids. These growths are not cancer. Problems with two hormones called estrogen and progesterone. One of the ovaries not releasing an egg during one or more months. A problem with the thyroid gland. Having a device for birth control (IUD). Side effects of some medicines, such as NSAIDs or blood thinners. A disorder that stops the blood from clotting normally. What increases the risk? You are more likely to have this condition if you have cancer of the womb. What are the signs or symptoms? Having to change your pad or tampon every 1-2 hours because it is soaked. Needing to use pads and tampons at the same time because of heavy bleeding. Needing to wake up to change your pads or tampons during the night. Passing blood clots larger than 1 inch (2.5 cm) in size. Having bleeding that lasts for more than 7 days. Having symptoms of low iron levels (anemia), such as feeling tired or having shortness of breath. How is this treated? You may not need to be treated for this condition. But if you need treatment, you may be given medicines: To reduce bleeding during your period. These include birth control medicines. To make your blood thick. This slows bleeding. To reduce swelling. Medicines that do this include ibuprofen. That have a hormone called progestin. That make the ovaries stop working for a short time. To treat low iron levels. You will be given iron pills if you have this condition. If medicines do not work, surgery may be done. Surgery may be done to: Remove a part of the lining of the womb. This lining is called the endometrium. This reduces bleeding during a period. Remove growths  in the womb. These may be polyps or fibroids. Remove the entire lining of the womb. Remove the womb entirely. This procedure is called a hysterectomy. Follow these instructions at home: Medicines Take over-the-counter and prescription medicines only as told by your doctor. This includes iron pills. Do not change or switch medicines without asking your doctor. Do not take aspirin or medicines that contain aspirin 1 week before or during your period. Aspirin may make bleeding worse. Managing constipation Iron pills may cause trouble pooping (constipation). To prevent or treat problems when pooping, you may need to: Drink enough fluid to keep your pee (urine) pale yellow. Take over-the-counter or prescription medicines. Eat foods that are high in fiber. These include beans, whole grains, and fresh fruits and vegetables. Limit foods that are high in fat and sugar. These include fried or sweet foods. General instructions If you need to change your pad or tampon more than once every 2 hours, limit your activity until the bleeding stops. Eat healthy meals and foods that are high in iron. Foods that have a lot of iron include: Leafy green vegetables. Meat. Liver. Eggs. Whole-grain breads and cereals. Do not try to lose weight until your heavy bleeding has stopped and you have normal amounts of iron in your blood. If you need to lose weight, work with your doctor. Keep all follow-up visits. Contact a doctor if: You soak through a pad or tampon every 1 or 2 hours, and this happens every time you have a period. You need to use pads and   tampons at the same time because you are bleeding so much. You are taking medicine, and: You feel like you may vomit. You vomit. You have watery poop (diarrhea). You have other problems that may be related to the medicine you are taking. Get help right away if: You soak through more than a pad or tampon in 1 hour. You pass clots bigger than 1 inch (2.5 cm)  wide. You feel short of breath. You feel like your heart is beating too fast. You feel dizzy or you faint. You feel very weak or tired. Summary Menorrhagia is when your menstrual periods are heavy or last longer than normal. You may not need to be treated for this condition. If you need treatment, you may be given medicines or have surgery. Take over-the-counter and prescription medicines only as told by your doctor. This includes iron pills. Get help right away if you soak through more than a pad or tampon in 1 hour or you pass large clots. Also, get help right away if you feel dizzy, short of breath, or very weak or tired. This information is not intended to replace advice given to you by your health care provider. Make sure you discuss any questions you have with your health care provider. Document Revised: 12/31/2019 Document Reviewed: 12/31/2019 Elsevier Patient Education  2023 [employer]  

## 2022-01-11 LAB — CMP14+EGFR
ALT: 14 IU/L (ref 0–32)
AST: 17 IU/L (ref 0–40)
Albumin/Globulin Ratio: 1.5 (ref 1.2–2.2)
Albumin: 4.4 g/dL (ref 3.9–4.9)
Alkaline Phosphatase: 59 IU/L (ref 44–121)
BUN/Creatinine Ratio: 9 (ref 9–23)
BUN: 7 mg/dL (ref 6–20)
Bilirubin Total: 0.4 mg/dL (ref 0.0–1.2)
CO2: 20 mmol/L (ref 20–29)
Calcium: 9.5 mg/dL (ref 8.7–10.2)
Chloride: 104 mmol/L (ref 96–106)
Creatinine, Ser: 0.81 mg/dL (ref 0.57–1.00)
Globulin, Total: 2.9 g/dL (ref 1.5–4.5)
Glucose: 80 mg/dL (ref 70–99)
Potassium: 4.5 mmol/L (ref 3.5–5.2)
Sodium: 139 mmol/L (ref 134–144)
Total Protein: 7.3 g/dL (ref 6.0–8.5)
eGFR: 98 mL/min/{1.73_m2} (ref >59)

## 2022-01-11 LAB — CBC WITH DIFFERENTIAL/PLATELET
Basophils Absolute: 0.1 10*3/uL (ref 0.0–0.2)
Basos: 1 %
EOS (ABSOLUTE): 0.7 10*3/uL — ABNORMAL HIGH (ref 0.0–0.4)
Eos: 14 %
Hematocrit: 37.4 % (ref 34.0–46.6)
Hemoglobin: 12.5 g/dL (ref 11.1–15.9)
Immature Grans (Abs): 0 10*3/uL (ref 0.0–0.1)
Immature Granulocytes: 0 %
Lymphocytes Absolute: 2.2 10*3/uL (ref 0.7–3.1)
Lymphs: 44 %
MCH: 31.4 pg (ref 26.6–33.0)
MCHC: 33.4 g/dL (ref 31.5–35.7)
MCV: 94 fL (ref 79–97)
Monocytes Absolute: 0.5 10*3/uL (ref 0.1–0.9)
Monocytes: 9 %
Neutrophils Absolute: 1.6 10*3/uL (ref 1.4–7.0)
Neutrophils: 32 %
Platelets: 315 10*3/uL (ref 150–450)
RBC: 3.98 x10E6/uL (ref 3.77–5.28)
RDW: 11.8 % (ref 11.7–15.4)
WBC: 5.1 10*3/uL (ref 3.4–10.8)

## 2022-01-11 LAB — TSH+FREE T4
Free T4: 1.13 ng/dL (ref 0.82–1.77)
TSH: 1.3 u[IU]/mL (ref 0.450–4.500)

## 2022-01-11 LAB — HEPATITIS C ANTIBODY: Hep C Virus Ab: NONREACTIVE

## 2022-01-11 LAB — VITAMIN D 25 HYDROXY (VIT D DEFICIENCY, FRACTURES): Vit D, 25-Hydroxy: 12.2 ng/mL — ABNORMAL LOW (ref 30.0–100.0)

## 2022-01-13 ENCOUNTER — Other Ambulatory Visit (INDEPENDENT_AMBULATORY_CARE_PROVIDER_SITE_OTHER): Payer: Self-pay | Admitting: Primary Care

## 2022-01-13 MED ORDER — LEVOTHYROXINE SODIUM 50 MCG PO TABS: 50.0000 ug | ORAL_TABLET | Freq: Every day | ORAL | 0 refills | Status: DC

## 2022-02-07 ENCOUNTER — Encounter: Payer: Self-pay | Admitting: Psychiatry

## 2022-02-14 ENCOUNTER — Telehealth: Payer: Self-pay | Admitting: *Deleted

## 2022-02-14 DIAGNOSIS — Z0289 Encounter for other administrative examinations: Secondary | ICD-10-CM

## 2022-02-14 NOTE — Telephone Encounter (Signed)
FMLA papers completed, signed, sent to medical records for processing. 

## 2022-02-14 NOTE — Telephone Encounter (Signed)
Received FMLA papers from patient. On MD desk for completion, signature.

## 2022-02-15 NOTE — Telephone Encounter (Signed)
Faxed FMLA paperwork to [place] [employer] and called pt to p/u copy

## 2022-04-11 ENCOUNTER — Ambulatory Visit (INDEPENDENT_AMBULATORY_CARE_PROVIDER_SITE_OTHER): Payer: Medicaid Other | Admitting: Primary Care

## 2022-04-20 ENCOUNTER — Encounter (HOSPITAL_BASED_OUTPATIENT_CLINIC_OR_DEPARTMENT_OTHER): Payer: Self-pay

## 2022-04-20 ENCOUNTER — Emergency Department (HOSPITAL_BASED_OUTPATIENT_CLINIC_OR_DEPARTMENT_OTHER)
Admission: EM | Admit: 2022-04-20 | Discharge: 2022-04-20 | Disposition: A | Discharge status: Home / Self Care | Payer: Medicaid Other | Attending: Emergency Medicine | Admitting: Emergency Medicine

## 2022-04-20 DIAGNOSIS — F17210 Nicotine dependence, cigarettes, uncomplicated: Secondary | ICD-10-CM | POA: Diagnosis not present

## 2022-04-20 DIAGNOSIS — R112 Nausea with vomiting, unspecified: Secondary | ICD-10-CM | POA: Diagnosis not present

## 2022-04-20 DIAGNOSIS — R1030 Lower abdominal pain, unspecified: Secondary | ICD-10-CM | POA: Diagnosis not present

## 2022-04-20 DIAGNOSIS — R1084 Generalized abdominal pain: Secondary | ICD-10-CM | POA: Diagnosis not present

## 2022-04-20 LAB — CBC
HCT: 37.3 % (ref 36.0–46.0)
Hemoglobin: 12.5 g/dL (ref 12.0–15.0)
MCH: 31.7 pg (ref 26.0–34.0)
MCHC: 33.5 g/dL (ref 30.0–36.0)
MCV: 94.7 fL (ref 80.0–100.0)
Platelets: 428 10*3/uL — ABNORMAL HIGH (ref 150–400)
RBC: 3.94 MIL/uL (ref 3.87–5.11)
RDW: 12.6 % (ref 11.5–15.5)
WBC: 9.4 10*3/uL (ref 4.0–10.5)
nRBC: 0 % (ref 0.0–0.2)

## 2022-04-20 LAB — URINALYSIS, ROUTINE W REFLEX MICROSCOPIC
Bilirubin Urine: NEGATIVE
Glucose, UA: NEGATIVE mg/dL
Ketones, ur: 15 mg/dL — AB
Leukocytes,Ua: NEGATIVE
Nitrite: NEGATIVE
Protein, ur: 30 mg/dL — AB
Specific Gravity, Urine: 1.02 (ref 1.005–1.030)
pH: 8.5 — ABNORMAL HIGH (ref 5.0–8.0)

## 2022-04-20 LAB — COMPREHENSIVE METABOLIC PANEL
ALT: 15 U/L (ref 0–44)
AST: 21 U/L (ref 15–41)
Albumin: 4.8 g/dL (ref 3.5–5.0)
Alkaline Phosphatase: 48 U/L (ref 38–126)
Anion gap: 10 (ref 5–15)
BUN: 12 mg/dL (ref 6–20)
CO2: 20 mmol/L — ABNORMAL LOW (ref 22–32)
Calcium: 9.6 mg/dL (ref 8.9–10.3)
Chloride: 108 mmol/L (ref 98–111)
Creatinine, Ser: 0.86 mg/dL (ref 0.44–1.00)
GFR, Estimated: >60 mL/min (ref >60)
Glucose, Bld: 132 mg/dL — ABNORMAL HIGH (ref 70–99)
Potassium: 3.5 mmol/L (ref 3.5–5.1)
Sodium: 138 mmol/L (ref 135–145)
Total Bilirubin: 0.3 mg/dL (ref 0.3–1.2)
Total Protein: 8.2 g/dL — ABNORMAL HIGH (ref 6.5–8.1)

## 2022-04-20 LAB — RAPID URINE DRUG SCREEN, HOSP PERFORMED
Amphetamines: NOT DETECTED
Barbiturates: NOT DETECTED
Benzodiazepines: NOT DETECTED
Cocaine: NOT DETECTED
Opiates: NOT DETECTED
Tetrahydrocannabinol: POSITIVE — AB

## 2022-04-20 LAB — PREGNANCY, URINE: Preg Test, Ur: NEGATIVE

## 2022-04-20 LAB — LIPASE, BLOOD: Lipase: 15 U/L (ref 11–51)

## 2022-04-20 MED ORDER — DIPHENHYDRAMINE HCL 50 MG/ML IJ SOLN
12.5000 mg | Freq: Once | INTRAMUSCULAR | Status: AC
  Administered 2022-04-20: 12.5 mg via INTRAVENOUS
  Filled 2022-04-20: qty 1

## 2022-04-20 MED ORDER — LACTATED RINGERS IV BOLUS
1000.0000 mL | Freq: Once | INTRAVENOUS | Status: AC
  Administered 2022-04-20: 1000 mL via INTRAVENOUS

## 2022-04-20 MED ORDER — DROPERIDOL 2.5 MG/ML IJ SOLN
2.5000 mg | Freq: Once | INTRAMUSCULAR | Status: AC
  Administered 2022-04-20: 2.5 mg via INTRAVENOUS
  Filled 2022-04-20: qty 2

## 2022-04-20 NOTE — ED Triage Notes (Signed)
She c/o severe and persistent low abd.  Pain which started at about 0700 today. She also tells me she has had a couple of diarrhea stools.

## 2022-04-20 NOTE — ED Triage Notes (Signed)
We continue to attempt to obtain v.s. Thus far the pt. Is too agitated to accomplish this.

## 2022-04-20 NOTE — ED Notes (Signed)
Pt denies ability to provide urine sample at this time. Pt writhing in bed, moaning, moderately cooperative with a high volume of reassurance and breathing instructions.

## 2022-04-20 NOTE — ED Provider Notes (Signed)
[facility] EMERGENCY DEPT Provider Note  CSN: [id number] Arrival date & time: 04/20/22 1002  Chief Complaint(s) Abdominal Pain  HPI Brooke Christian is a 36 y.o. female without significant past medical history presenting to the emergency department with nausea and vomiting.  Patient complaining of nausea and vomiting starting today, diffuse abdominal pain.  She also reports diarrhea.  No hematemesis, hematochezia, melena.  No sore throat, runny nose, fevers.  No dysuria.  No vaginal discharge or bleeding.  She reports that "I cannot get comfortable "   Past Medical History History reviewed. No pertinent past medical history. There are no problems to display for this patient.  Home Medication(s) Prior to Admission medications   Medication Sig Start Date End Date Taking? Authorizing Provider  albuterol (VENTOLIN HFA) 108 (90 Base) MCG/ACT inhaler Inhale 1-2 puffs into the lungs every 4 (four) hours as needed for wheezing or shortness of breath. Patient not taking: Reported on 11/18/2021 07/26/21   Zenia Resides, MD  baclofen (LIORESAL) 10 MG tablet Take 1 tablet (10 mg total) by mouth 2 (two) times daily as needed for muscle spasms. 11/08/21   Raspet, Noberto Retort, PA-C  levothyroxine (SYNTHROID) 50 MCG tablet Take 1 tablet (50 mcg total) by mouth daily. 01/13/22   Grayce Sessions, NP  montelukast (SINGULAIR) 10 MG tablet Take 1 tablet (10 mg total) by mouth at bedtime. Patient not taking: Reported on 11/18/2021 07/26/21   Zenia Resides, MD  ondansetron (ZOFRAN-ODT) 4 MG disintegrating tablet Take 1 tablet (4 mg total) by mouth every 8 (eight) hours as needed for nausea or vomiting. 11/08/21   Raspet, Denny Peon K, PA-C  rizatriptan (MAXALT) 10 MG tablet Take 1 tablet (10 mg total) by mouth as needed for migraine. May repeat in 2 hours if needed 11/22/21   Ocie Doyne, MD                                                                                                                                     Past Surgical History Past Surgical History:  Procedure Laterality Date   TUBAL LIGATION     Family History Family History  Problem Relation Age of Onset   Healthy Mother    Healthy Father     Social History Social History   Tobacco Use   Smoking status: Every Day    Types: Cigarettes   Smokeless tobacco: Never  Vaping Use   Vaping Use: Never used  Substance Use Topics   Alcohol use: No   Drug use: Yes    Types: Marijuana   Allergies Naproxen  Review of Systems Review of Systems  All other systems reviewed and are negative.   Physical Exam Vital Signs  I have reviewed the triage vital signs BP (!) 141/111 (BP Location: Right Arm)   Pulse 61   Temp 97.6 F (36.4 C) (Oral)   Resp (!) 24   LMP 04/18/2022 (  Exact Date) Comment: Currently on menses 04-20-2022  SpO2 100%  Physical Exam Vitals and nursing note reviewed.  Constitutional:      General: She is in acute distress.     Appearance: She is well-developed.     Comments: Smells of marijuana  HENT:     Head: Normocephalic and atraumatic.     Mouth/Throat:     Mouth: Mucous membranes are moist.  Eyes:     Pupils: Pupils are equal, round, and reactive to light.  Cardiovascular:     Rate and Rhythm: Normal rate and regular rhythm.     Heart sounds: No murmur heard. Pulmonary:     Effort: Pulmonary effort is normal. No respiratory distress.     Breath sounds: Normal breath sounds.  Abdominal:     General: Abdomen is flat.     Palpations: Abdomen is soft.     Tenderness: There is no abdominal tenderness.  Musculoskeletal:        General: No tenderness.     Right lower leg: No edema.     Left lower leg: No edema.  Skin:    General: Skin is warm and dry.  Neurological:     General: No focal deficit present.     Mental Status: She is alert. Mental status is at baseline.  Psychiatric:        Mood and Affect: Mood normal.        Behavior: Behavior normal.     ED Results and  Treatments Labs (all labs ordered are listed, but only abnormal results are displayed) Labs Reviewed  COMPREHENSIVE METABOLIC PANEL - Abnormal; Notable for the following components:      Result Value   CO2 20 (*)    Glucose, Bld 132 (*)    Total Protein 8.2 (*)    All other components within normal limits  CBC - Abnormal; Notable for the following components:   Platelets 428 (*)    All other components within normal limits  URINALYSIS, ROUTINE W REFLEX MICROSCOPIC - Abnormal; Notable for the following components:   pH 8.5 (*)    Hgb urine dipstick MODERATE (*)    Ketones, ur 15 (*)    Protein, ur 30 (*)    All other components within normal limits  RAPID URINE DRUG SCREEN, HOSP PERFORMED - Abnormal; Notable for the following components:   Tetrahydrocannabinol POSITIVE (*)    All other components within normal limits  LIPASE, BLOOD  PREGNANCY, URINE                                                                                                                          Radiology No results found.  Pertinent labs & imaging results that were available during my care of the patient were reviewed by me and considered in my medical decision making (see MDM for details).  Medications Ordered in ED Medications  droperidol (INAPSINE) 2.5 MG/ML injection 2.5 mg (2.5 mg Intravenous Given 04/20/22 1121)  diphenhydrAMINE (BENADRYL) injection 12.5 mg (12.5 mg Intravenous Given 04/20/22 1125)  lactated ringers bolus 1,000 mL (1,000 mLs Intravenous New Bag/Given 04/20/22 1329)                                                                                                                                     Procedures Procedures  (including critical care time)  Medical Decision Making / ED Course   MDM:  36 year old female presenting with nausea and vomiting.  Patient appears uncomfortable, in mild distress due to symptoms.  She smells of marijuana.  She has no abdominal  tenderness.  Differential includes cyclic vomiting syndrome, gastroenteritis, gastroparesis, no focal tenderness to suggest pancreatitis, cholecystitis, biliary colic.  Low concern for small bowel obstruction, patient still having bowel movements.  Given age, doubt vascular catastrophe.  Will treat symptoms and reassess.  Will obtain lab testing to further evaluate.  Will check pregnancy and urinalysis.  Clinical Course as of 04/20/22 1353  Wed Apr 20, 2022  1348 Patient feels better, no ongoing vomiting.  Laboratory testing reassuring with normal LFTs, normal lipase, no leukocytosis.  No abdominal pain.  Urine pregnancy negative.  Urinalysis reassuring.  Suspect possibly cyclic vomiting syndrome, patient admits to marijuana use and UDS positive for marijuana.  Able to tolerate fluids in the emergency department.  Advise abstinence from marijuana. Will discharge patient to home. All questions answered. Patient comfortable with plan of discharge. Return precautions discussed with patient and specified on the after visit summary.  [WS]    Clinical Course User Index [WS] Lonell Grandchild, MD     Additional history obtained:  -External records from outside source obtained and reviewed including: Chart review including previous notes, labs, imaging, consultation notes including UC visit 11/08/21   Lab Tests: -I ordered, reviewed, and interpreted labs.   The pertinent results include:   Labs Reviewed  COMPREHENSIVE METABOLIC PANEL - Abnormal; Notable for the following components:      Result Value   CO2 20 (*)    Glucose, Bld 132 (*)    Total Protein 8.2 (*)    All other components within normal limits  CBC - Abnormal; Notable for the following components:   Platelets 428 (*)    All other components within normal limits  URINALYSIS, ROUTINE W REFLEX MICROSCOPIC - Abnormal; Notable for the following components:   pH 8.5 (*)    Hgb urine dipstick MODERATE (*)    Ketones, ur 15 (*)     Protein, ur 30 (*)    All other components within normal limits  RAPID URINE DRUG SCREEN, HOSP PERFORMED - Abnormal; Notable for the following components:   Tetrahydrocannabinol POSITIVE (*)    All other components within normal limits  LIPASE, BLOOD  PREGNANCY, URINE    Notable for +THC UDS, normal LFTs and lipase, very mild low CO2 likely due to dehydration    Medicines ordered and prescription drug  management: Meds ordered this encounter  Medications   droperidol (INAPSINE) 2.5 MG/ML injection 2.5 mg   diphenhydrAMINE (BENADRYL) injection 12.5 mg   lactated ringers bolus 1,000 mL    -I have reviewed the patients home medicines and have made adjustments as needed   Social Determinants of Health:  Diagnosis or treatment significantly limited by social determinants of health: marijuana use   Reevaluation: After the interventions noted above, I reevaluated the patient and found that they have improved without residual symptoms  Co morbidities that complicate the patient evaluation History reviewed. No pertinent past medical history.    Dispostion: Disposition decision including need for hospitalization was considered, and patient discharged from emergency department.    Final Clinical Impression(s) / ED Diagnoses Final diagnoses:  Nausea and vomiting, unspecified vomiting type     This chart was dictated using voice recognition software.  Despite best efforts to proofread,  errors can occur which can change the documentation meaning.    Lonell GrandchildScheving, Jhalil Silvera L, MD 04/20/22 [phone]

## 2022-04-22 ENCOUNTER — Telehealth: Payer: Self-pay | Admitting: Obstetrics and Gynecology

## 2022-04-22 NOTE — Patient Outreach (Signed)
Transition Care Management Follow-up Telephone Call Date of discharge and from where: [facility], 04/20/22 How have you been since you were released from the hospital? Getting better now Any questions or concerns? No  Items Reviewed: Did the pt receive and understand the discharge instructions provided? Yes  Medications obtained and verified? N/A Other? No  Any new allergies since your discharge? No  Dietary orders reviewed? No Do you have support at home? Yes   Home Care and Equipment/Supplies: Were home health services ordered? not applicable If so, what is the name of the agency? N/A  Has the agency set up a time to come to the patient's home? not applicable Were any new equipment or medical supplies ordered?  No What is the name of the medical supply agency? N/A Were you able to get the supplies/equipment? not applicable Do you have any questions related to the use of the equipment or supplies? No  Functional Questionnaire: (I = Independent and D = Dependent) ADLs: I  Bathing/Dressing- I  Meal Prep- I  Eating- I  Maintaining continence- I  Transferring/Ambulation- I  Managing Meds- I  Follow up appointments reviewed:  PCP Hospital f/u appt confirmed? No  , no PCP f/u recommended Specialist Hospital f/u appt confirmed? No  , no specialist recommended Are transportation arrangements needed? No  If their condition worsens, is the pt aware to call PCP or go to the Emergency Dept.? Yes Was the patient provided with contact information for the PCP's office or ED? Yes Was to pt encouraged to call back with questions or concerns? Yes

## 2022-05-24 NOTE — Progress Notes (Signed)
Primary neurologist: Dr. Delena Bali PCP: Grayce Sessions, NP    CC:  headaches Chief Complaint  Patient presents with   Follow-up    Rm 8 alone Pt is well and stable, migraines are about the same as last visit, mostly around menstrual time.      History provided from self  Follow-up visit:  Prior visit: 11/18/2021 with Dr. Delena Bali (initial consult visit)   Brief HPI:   Brooke Christian is a 37 y.o. 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  At prior visit, was started on naratriptan for rescue but insurance declined therefore switched to rizatriptan.  Interval history:  Reports migraines about the same as prior visit, continues to occur around menstrual cycle and can last up to 3-4 days. Denies any benefit with use of rizatriptan nor baclofen. Will use zofran for nausea with benefit.     Headache History: Onset: April 2023 Triggers: menstrual cycle Aura: no Location: retro-orbital, occiput Associated Symptoms:  Photophobia: yes  Phonophobia: yes  Nausea: yes Vomiting: yes Worse with activity?: yes Duration of headaches: 3-4 days  Headache days per month: 4 Headache free days per month: 26  Current Treatment: Abortive Rizatriptan Zofran  Preventative none  Prior Therapies                                 Baclofen Zofran Excedrin Toradol Rizatriptan - not effective    LABS: CBC    Component Value Date/Time   WBC 9.4 04/20/2022 1109   RBC 3.94 04/20/2022 1109   HGB 12.5 04/20/2022 1109   HGB 12.5 01/10/2022 1002   HCT 37.3 04/20/2022 1109   HCT 37.4 01/10/2022 1002   PLT 428 (H) 04/20/2022 1109   PLT 315 01/10/2022 1002   MCV 94.7 04/20/2022 1109   MCV 94 01/10/2022 1002   MCH 31.7 04/20/2022 1109   MCHC 33.5 04/20/2022 1109   RDW 12.6 04/20/2022 1109   RDW 11.8 01/10/2022 1002   LYMPHSABS 2.2 01/10/2022 1002   EOSABS 0.7 (H) 01/10/2022 1002   BASOSABS 0.1 01/10/2022 1002       Latest Ref Rng & Units 04/20/2022   11:09 AM 01/10/2022   10:02 AM 08/15/2007    4:28 PM  CMP  Glucose 70 - 99 mg/dL 433  80  83   BUN 6 - 20 mg/dL 12  7  <3   Creatinine 0.44 - 1.00 mg/dL 2.95  1.88  0.5   Sodium 135 - 145 mmol/L 138  139  137   Potassium 3.5 - 5.1 mmol/L 3.5  4.5  3.6   Chloride 98 - 111 mmol/L 108  104  105   CO2 22 - 32 mmol/L 20  20    Calcium 8.9 - 10.3 mg/dL 9.6  9.5    Total Protein 6.5 - 8.1 g/dL 8.2  7.3    Total Bilirubin 0.3 - 1.2 mg/dL 0.3  0.4    Alkaline Phos 38 - 126 U/L 48  59    AST 15 - 41 U/L 21  17    ALT 0 - 44 U/L 15  14       IMAGING:  none   Current Outpatient Medications on File Prior to Visit  Medication Sig Dispense Refill   albuterol (VENTOLIN HFA) 108 (90 Base) MCG/ACT inhaler Inhale 1-2 puffs into the lungs every 4 (four) hours as needed for  wheezing or shortness of breath. 1 each 0   baclofen (LIORESAL) 10 MG tablet Take 1 tablet (10 mg total) by mouth 2 (two) times daily as needed for muscle spasms. 30 each 0   levothyroxine (SYNTHROID) 50 MCG tablet Take 1 tablet (50 mcg total) by mouth daily. 90 tablet 0   montelukast (SINGULAIR) 10 MG tablet Take 1 tablet (10 mg total) by mouth at bedtime. 30 tablet 2   ondansetron (ZOFRAN-ODT) 4 MG disintegrating tablet Take 1 tablet (4 mg total) by mouth every 8 (eight) hours as needed for nausea or vomiting. 20 tablet 0   rizatriptan (MAXALT) 10 MG tablet Take 1 tablet (10 mg total) by mouth as needed for migraine. May repeat in 2 hours if needed 10 tablet 6   No current facility-administered medications on file prior to visit.     Allergies: Allergies  Allergen Reactions   Naproxen Nausea And Vomiting    Family History: Migraine or other headaches in the family:  no Aneurysms in a first degree relative:  no Brain tumors in the family:  no Other neurological illness in the family:   no  Past Medical History: No past medical history on file.  Past Surgical History Past Surgical  History:  Procedure Laterality Date   TUBAL LIGATION      Social History: Social History   Tobacco Use   Smoking status: Every Day    Types: Cigarettes   Smokeless tobacco: Never  Vaping Use   Vaping Use: Never used  Substance Use Topics   Alcohol use: No   Drug use: Yes    Types: Marijuana     ROS: Negative for fevers, chills. Positive for headaches. All other systems reviewed and negative unless stated otherwise in HPI.   Physical Exam:   Vital Signs: BP 108/69   Pulse 78   Ht 5\' 8"  (1.727 m)   Wt 154 lb (69.9 kg)   BMI 23.42 kg/m  GENERAL: well appearing,in no acute distress,alert SKIN:  Color, texture, turgor normal. No rashes or lesions HEAD:  Normocephalic/atraumatic. CV:  RRR RESP: Normal respiratory effort MSK: no tenderness to palpation over occiput, neck, or shoulders  NEUROLOGICAL: Mental Status: Alert, oriented to person, place and time,Follows commands Cranial Nerves: PERRL, visual fields intact to confrontation, extraocular movements intact, decreased sensation over left V2, no facial droop or ptosis, hearing grossly intact, no dysarthria Motor: muscle strength 5/5 both upper and lower extremities,no drift, normal tone Reflexes: 2+ throughout Sensation: decreased sensation to light touch over LLE Coordination: Finger-to- nose-finger intact bilaterally,Heel-to-shin intact bilaterally Gait: normal-based   IMPRESSION: 37 year old female with a history of asthma who presents for follow-up of worsening headaches.  Initially seen by Dr. Billey Gosling on 11/18/2021.  MRI brain unremarkable.  Felt headaches consistent with menstrual migraine.  Use of rizatriptan without benefit.     PLAN:  -Start sumatriptan 100mg  PRN for migraine rescue.  If no benefit or unable to tolerate, will then recommend proceeding with naratriptan. Insurance requiring trial of both sumatriptan and rizatriptan prior to naratriptan coverage.  -continue Zofran as needed for nausea  associated with migraine -refill provided -Advised to call with any worsening headaches or change in headache characteristics   Follow-up in 6 months or call earlier if needed   I spent 16 minutes of face-to-face and non-face-to-face time with patient.  This included previsit chart review, lab review, study review, order entry, electronic health record documentation, patient education and discussion regarding above diagnoses and treatment plan  and answered all the questions to patient's satisfaction  Frann Rider, [facility]  [facility] [address] [place], [place] [address]  Phone [phone] Fax [phone] Note: This document was prepared with digital dictation and possible smart phrase technology. Any transcriptional errors that result from this process are unintentional.

## 2022-05-25 ENCOUNTER — Encounter: Payer: Self-pay | Admitting: Adult Health

## 2022-05-25 ENCOUNTER — Ambulatory Visit (INDEPENDENT_AMBULATORY_CARE_PROVIDER_SITE_OTHER): Payer: Medicaid Other | Admitting: Adult Health

## 2022-05-25 VITALS — BP 108/69 | HR 78 | Ht 68.0 in | Wt 154.0 lb

## 2022-05-25 DIAGNOSIS — G43829 Menstrual migraine, not intractable, without status migrainosus: Secondary | ICD-10-CM

## 2022-05-25 MED ORDER — ONDANSETRON 4 MG PO TBDP: 4.0000 mg | ORAL_TABLET | Freq: Three times a day (TID) | ORAL | 5 refills | Status: DC | PRN

## 2022-05-25 MED ORDER — SUMATRIPTAN SUCCINATE 100 MG PO TABS: 100.0000 mg | ORAL_TABLET | Freq: Once | ORAL | 5 refills | Status: DC | PRN

## 2022-05-25 NOTE — Patient Instructions (Addendum)
Your Plan:  Recommend trying sumatriptan to take at onset, can repeat after 2 hours if no benefit. No more than 2 tabs in 24hrs   If no benefit or unable to tolerate, please call and we will try to get naratriptan approved at that time     Follow up in 6 months or call earlier if needed     Thank you for coming to see [place] at [facility]. I hope we have been able to provide you high quality care today.  You may receive a patient satisfaction survey over the next few weeks. We would appreciate your feedback and comments so that we may continue to improve ourselves and the health of our patients.

## 2022-07-05 ENCOUNTER — Encounter: Payer: Self-pay | Admitting: Adult Health

## 2022-07-11 DIAGNOSIS — Z0289 Encounter for other administrative examinations: Secondary | ICD-10-CM

## 2022-07-13 ENCOUNTER — Telehealth: Payer: Self-pay | Admitting: *Deleted

## 2022-07-13 NOTE — Telephone Encounter (Signed)
Pt fmla form @ front for p/u

## 2022-07-13 NOTE — Telephone Encounter (Signed)
FMLA paperwork completed, signed and given to MR.

## 2022-08-05 DIAGNOSIS — Z0279 Encounter for issue of other medical certificate: Secondary | ICD-10-CM | POA: Diagnosis not present

## 2022-08-22 DIAGNOSIS — Z0289 Encounter for other administrative examinations: Secondary | ICD-10-CM

## 2022-08-24 DIAGNOSIS — M545 Low back pain, unspecified: Secondary | ICD-10-CM | POA: Diagnosis not present

## 2022-08-24 DIAGNOSIS — M549 Dorsalgia, unspecified: Secondary | ICD-10-CM | POA: Diagnosis not present

## 2022-08-24 DIAGNOSIS — M542 Cervicalgia: Secondary | ICD-10-CM | POA: Diagnosis not present

## 2022-08-24 DIAGNOSIS — M79604 Pain in right leg: Secondary | ICD-10-CM | POA: Diagnosis not present

## 2022-09-05 ENCOUNTER — Encounter: Payer: Self-pay | Admitting: Adult Health

## 2022-09-06 NOTE — Telephone Encounter (Signed)
Pt came in office with new FMLA paperwork due to previous one expired and employer will not except. Advised NP and MD are both out on leave, work-in will have to review and be willing to sign. I have completed forms and copied the same information NP signed off on. New forms placed in work-in MD office.     Fax number is [phone]

## 2022-09-07 DIAGNOSIS — Z1331 Encounter for screening for depression: Secondary | ICD-10-CM | POA: Diagnosis not present

## 2022-09-07 DIAGNOSIS — Z3200 Encounter for pregnancy test, result unknown: Secondary | ICD-10-CM | POA: Diagnosis not present

## 2022-09-07 DIAGNOSIS — R0602 Shortness of breath: Secondary | ICD-10-CM | POA: Diagnosis not present

## 2022-09-07 DIAGNOSIS — R030 Elevated blood-pressure reading, without diagnosis of hypertension: Secondary | ICD-10-CM | POA: Diagnosis not present

## 2022-09-07 DIAGNOSIS — Z0000 Encounter for general adult medical examination without abnormal findings: Secondary | ICD-10-CM | POA: Diagnosis not present

## 2022-09-07 NOTE — Telephone Encounter (Addendum)
FMLA completed, signed and given to debra in MR.

## 2022-09-14 DIAGNOSIS — D539 Nutritional anemia, unspecified: Secondary | ICD-10-CM | POA: Diagnosis not present

## 2022-09-14 DIAGNOSIS — Z0000 Encounter for general adult medical examination without abnormal findings: Secondary | ICD-10-CM | POA: Diagnosis not present

## 2022-09-14 DIAGNOSIS — Z79899 Other long term (current) drug therapy: Secondary | ICD-10-CM | POA: Diagnosis not present

## 2022-09-14 DIAGNOSIS — Z1159 Encounter for screening for other viral diseases: Secondary | ICD-10-CM | POA: Diagnosis not present

## 2022-09-14 DIAGNOSIS — E559 Vitamin D deficiency, unspecified: Secondary | ICD-10-CM | POA: Diagnosis not present

## 2022-09-14 DIAGNOSIS — R5383 Other fatigue: Secondary | ICD-10-CM | POA: Diagnosis not present

## 2022-09-14 DIAGNOSIS — Z13220 Encounter for screening for lipoid disorders: Secondary | ICD-10-CM | POA: Diagnosis not present

## 2022-09-21 DIAGNOSIS — Z79899 Other long term (current) drug therapy: Secondary | ICD-10-CM | POA: Diagnosis not present

## 2022-09-26 DIAGNOSIS — Z79899 Other long term (current) drug therapy: Secondary | ICD-10-CM | POA: Diagnosis not present

## 2022-09-28 DIAGNOSIS — Z79899 Other long term (current) drug therapy: Secondary | ICD-10-CM | POA: Diagnosis not present

## 2022-09-30 DIAGNOSIS — Z79899 Other long term (current) drug therapy: Secondary | ICD-10-CM | POA: Diagnosis not present

## 2022-10-12 DIAGNOSIS — Z79899 Other long term (current) drug therapy: Secondary | ICD-10-CM | POA: Diagnosis not present

## 2022-10-14 DIAGNOSIS — Z79899 Other long term (current) drug therapy: Secondary | ICD-10-CM | POA: Diagnosis not present

## 2022-10-26 DIAGNOSIS — N946 Dysmenorrhea, unspecified: Secondary | ICD-10-CM | POA: Diagnosis not present

## 2022-11-09 DIAGNOSIS — Z79899 Other long term (current) drug therapy: Secondary | ICD-10-CM | POA: Diagnosis not present

## 2022-11-09 DIAGNOSIS — M545 Low back pain, unspecified: Secondary | ICD-10-CM | POA: Diagnosis not present

## 2022-11-09 DIAGNOSIS — G43909 Migraine, unspecified, not intractable, without status migrainosus: Secondary | ICD-10-CM | POA: Diagnosis not present

## 2022-11-09 DIAGNOSIS — Z6822 Body mass index (BMI) 22.0-22.9, adult: Secondary | ICD-10-CM | POA: Diagnosis not present

## 2022-11-09 DIAGNOSIS — N946 Dysmenorrhea, unspecified: Secondary | ICD-10-CM | POA: Diagnosis not present

## 2022-11-09 DIAGNOSIS — F419 Anxiety disorder, unspecified: Secondary | ICD-10-CM | POA: Diagnosis not present

## 2022-11-09 DIAGNOSIS — Z3200 Encounter for pregnancy test, result unknown: Secondary | ICD-10-CM | POA: Diagnosis not present

## 2022-11-09 DIAGNOSIS — M79604 Pain in right leg: Secondary | ICD-10-CM | POA: Diagnosis not present

## 2022-11-09 DIAGNOSIS — F32A Depression, unspecified: Secondary | ICD-10-CM | POA: Diagnosis not present

## 2022-11-09 DIAGNOSIS — E282 Polycystic ovarian syndrome: Secondary | ICD-10-CM | POA: Diagnosis not present

## 2022-11-09 DIAGNOSIS — E785 Hyperlipidemia, unspecified: Secondary | ICD-10-CM | POA: Diagnosis not present

## 2022-11-22 NOTE — Progress Notes (Deleted)
Primary neurologist: Dr. Delena Bali PCP: Grayce Sessions, NP    CC:  headaches No chief complaint on file.    History provided from self  Follow-up visit:  Prior visit: 05/25/2022   Brief HPI:   MAYGEN SIRICO is a 37 y.o. 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  At prior visit, was started on sumatriptan for migraine rescue, no benefit with use of rizatriptan  Interval history:    Reports migraines about the same as prior visit, continues to occur around menstrual cycle and can last up to 3-4 days. Denies any benefit with use of rizatriptan nor baclofen. Will use zofran for nausea with benefit.     Headache History: Onset: April 2023 Triggers: menstrual cycle Aura: no Location: retro-orbital, occiput Associated Symptoms:  Photophobia: yes  Phonophobia: yes  Nausea: yes Vomiting: yes Worse with activity?: yes Duration of headaches: 3-4 days  Headache days per month: 4 Headache free days per month: 26  Current Treatment: Abortive Rizatriptan Zofran  Preventative none  Prior Therapies                                 Baclofen Zofran Excedrin Toradol Rizatriptan - not effective    LABS: CBC    Component Value Date/Time   WBC 9.4 04/20/2022 1109   RBC 3.94 04/20/2022 1109   HGB 12.5 04/20/2022 1109   HGB 12.5 01/10/2022 1002   HCT 37.3 04/20/2022 1109   HCT 37.4 01/10/2022 1002   PLT 428 (H) 04/20/2022 1109   PLT 315 01/10/2022 1002   MCV 94.7 04/20/2022 1109   MCV 94 01/10/2022 1002   MCH 31.7 04/20/2022 1109   MCHC 33.5 04/20/2022 1109   RDW 12.6 04/20/2022 1109   RDW 11.8 01/10/2022 1002   LYMPHSABS 2.2 01/10/2022 1002   EOSABS 0.7 (H) 01/10/2022 1002   BASOSABS 0.1 01/10/2022 1002      Latest Ref Rng & Units 04/20/2022   11:09 AM 01/10/2022   10:02 AM 08/15/2007    4:28 PM  CMP  Glucose 70 - 99 mg/dL 409  80  83   BUN 6 - 20 mg/dL 12  7  <3   Creatinine 0.44 - 1.00  mg/dL 8.11  9.14  0.5   Sodium 135 - 145 mmol/L 138  139  137   Potassium 3.5 - 5.1 mmol/L 3.5  4.5  3.6   Chloride 98 - 111 mmol/L 108  104  105   CO2 22 - 32 mmol/L 20  20    Calcium 8.9 - 10.3 mg/dL 9.6  9.5    Total Protein 6.5 - 8.1 g/dL 8.2  7.3    Total Bilirubin 0.3 - 1.2 mg/dL 0.3  0.4    Alkaline Phos 38 - 126 U/L 48  59    AST 15 - 41 U/L 21  17    ALT 0 - 44 U/L 15  14       IMAGING:  none   Current Outpatient Medications on File Prior to Visit  Medication Sig Dispense Refill   albuterol (VENTOLIN HFA) 108 (90 Base) MCG/ACT inhaler Inhale 1-2 puffs into the lungs every 4 (four) hours as needed for wheezing or shortness of breath. 1 each 0   levothyroxine (SYNTHROID) 50 MCG tablet Take 1 tablet (50 mcg total) by mouth daily. 90 tablet 0   montelukast (SINGULAIR) 10 MG  tablet Take 1 tablet (10 mg total) by mouth at bedtime. 30 tablet 2   ondansetron (ZOFRAN-ODT) 4 MG disintegrating tablet Take 1 tablet (4 mg total) by mouth every 8 (eight) hours as needed for nausea or vomiting. 20 tablet 5   SUMAtriptan (IMITREX) 100 MG tablet Take 1 tablet (100 mg total) by mouth once as needed for up to 1 dose for migraine. May repeat in 2 hours if headache persists or recurs. 10 tablet 5   No current facility-administered medications on file prior to visit.     Allergies: Allergies  Allergen Reactions   Naproxen Nausea And Vomiting    Family History: Migraine or other headaches in the family:  no Aneurysms in a first degree relative:  no Brain tumors in the family:  no Other neurological illness in the family:   no  Past Medical History: No past medical history on file.  Past Surgical History Past Surgical History:  Procedure Laterality Date   TUBAL LIGATION      Social History: Social History   Tobacco Use   Smoking status: Every Day    Types: Cigarettes   Smokeless tobacco: Never  Vaping Use   Vaping status: Never Used  Substance Use Topics   Alcohol use:  No   Drug use: Yes    Types: Marijuana     ROS: Negative for fevers, chills. Positive for headaches. All other systems reviewed and negative unless stated otherwise in HPI.   Physical Exam:   Vital Signs: There were no vitals taken for this visit. GENERAL: well appearing,in no acute distress,alert SKIN:  Color, texture, turgor normal. No rashes or lesions HEAD:  Normocephalic/atraumatic. CV:  RRR RESP: Normal respiratory effort MSK: no tenderness to palpation over occiput, neck, or shoulders  NEUROLOGICAL: Mental Status: Alert, oriented to person, place and time,Follows commands Cranial Nerves: PERRL, visual fields intact to confrontation, extraocular movements intact, decreased sensation over left V2, no facial droop or ptosis, hearing grossly intact, no dysarthria Motor: muscle strength 5/5 both upper and lower extremities,no drift, normal tone Reflexes: 2+ throughout Sensation: decreased sensation to light touch over LLE Coordination: Finger-to- nose-finger intact bilaterally,Heel-to-shin intact bilaterally Gait: normal-based   IMPRESSION: 37 year old female with a history of asthma who presents for follow-up of worsening headaches.  Initially seen by Dr. Delena Bali on 11/18/2021.  MRI brain unremarkable.  Felt headaches consistent with menstrual migraine.  Use of rizatriptan without benefit.     PLAN:  -Start sumatriptan 100mg  PRN for migraine rescue.  If no benefit or unable to tolerate, will then recommend proceeding with naratriptan. Insurance requiring trial of both sumatriptan and rizatriptan prior to naratriptan coverage.  -continue Zofran as needed for nausea associated with migraine -refill provided -Advised to call with any worsening headaches or change in headache characteristics   Follow-up in 6 months or call earlier if needed   I spent 16 minutes of face-to-face and non-face-to-face time with patient.  This included previsit chart review, lab review, study  review, order entry, electronic health record documentation, patient education and discussion regarding above diagnoses and treatment plan and answered all the questions to patient's satisfaction  Ihor Austin, [facility]  [facility] [address] [place], [place] [address]  Phone [phone] Fax [phone] Note: This document was prepared with digital dictation and possible smart phrase technology. Any transcriptional errors that result from this process are unintentional.

## 2022-11-23 ENCOUNTER — Ambulatory Visit: Payer: Medicaid Other | Admitting: Adult Health

## 2022-11-28 ENCOUNTER — Ambulatory Visit: Payer: Medicaid Other | Attending: Physician Assistant

## 2022-11-28 NOTE — Therapy (Deleted)
OUTPATIENT PHYSICAL THERAPY THORACOLUMBAR EVALUATION   Patient Name: ELANOR HORACEK MRN: [id number] DOB:02/12/1986, 37 y.o., female Today's Date: 11/28/2022  END OF SESSION:   No past medical history on file. Past Surgical History:  Procedure Laterality Date   TUBAL LIGATION     There are no problems to display for this patient.   PCP: Grayce Sessions, NP   REFERRING PROVIDER: Jamey Reas, PA-C  REFERRING DIAG: M54.50 (ICD-10-CM) - Low back pain, unspecified  Rationale for Evaluation and Treatment: Rehabilitation  THERAPY DIAG:  No diagnosis found.  ONSET DATE: chronic  SUBJECTIVE:                                                                                                                                                                                           SUBJECTIVE STATEMENT: ***  PERTINENT HISTORY:    PAIN:  Are you having pain? {OPRCPAIN:27236}  PRECAUTIONS: None  RED FLAGS: None   WEIGHT BEARING RESTRICTIONS: No  FALLS:  Has patient fallen in last 6 months? No  OCCUPATION: ***  PLOF: Independent  PATIENT GOALS: To decrease and manage my back symptoms  NEXT MD VISIT: ***  OBJECTIVE:   DIAGNOSTIC FINDINGS:  none  PATIENT SURVEYS:  FOTO ***  MUSCLE LENGTH: Hamstrings: Right *** deg; Left *** deg Thomas test: Right *** deg; Left *** deg  POSTURE: {posture:25561}  PALPATION: ***  LUMBAR ROM:   AROM eval  Flexion   Extension   Right lateral flexion   Left lateral flexion   Right rotation   Left rotation    (Blank rows = not tested)  LOWER EXTREMITY ROM:     {AROM/PROM:27142}  Right eval Left eval  Hip flexion    Hip extension    Hip abduction    Hip adduction    Hip internal rotation    Hip external rotation    Knee flexion    Knee extension    Ankle dorsiflexion    Ankle plantarflexion    Ankle inversion    Ankle eversion     (Blank rows = not tested)  LOWER EXTREMITY MMT:    MMT  Right eval Left eval  Hip flexion    Hip extension    Hip abduction    Hip adduction    Hip internal rotation    Hip external rotation    Knee flexion    Knee extension    Ankle dorsiflexion    Ankle plantarflexion    Ankle inversion    Ankle eversion     (Blank rows = not tested)  LUMBAR SPECIAL TESTS:  Straight leg raise test: {pos/neg:25243},  Slump test: {pos/neg:25243}, and FABER test: {pos/neg:25243}  FUNCTIONAL TESTS:  5 times sit to stand: ***  GAIT: Distance walked: 31ftx2 Assistive device utilized: None Level of assistance: Complete Independence Comments: ***  TODAY'S TREATMENT:                                                                                                                              DATE: ***    PATIENT EDUCATION:  Education details: Discussed eval findings, rehab rationale and POC and patient is in agreement  Person educated: Patient Education method: Explanation Education comprehension: verbalized understanding and needs further education  HOME EXERCISE PROGRAM: ***  ASSESSMENT:  CLINICAL IMPRESSION: Patient is a *** y.o. *** who was seen today for physical therapy evaluation and treatment for ***.   OBJECTIVE IMPAIRMENTS: {opptimpairments:25111}.   ACTIVITY LIMITATIONS: {activitylimitations:27494}  PARTICIPATION LIMITATIONS: {participationrestrictions:25113}  PERSONAL FACTORS: {Personal factors:25162} are also affecting patient's functional outcome.   REHAB POTENTIAL: Fair based on chronicity and limited response to conservative treatment to date   CLINICAL DECISION MAKING: Stable/uncomplicated  EVALUATION COMPLEXITY: Low   GOALS: Goals reviewed with patient? No  SHORT TERM GOALS: Target date: ***  *** Baseline: Goal status: INITIAL  2.  *** Baseline:  Goal status: INITIAL  3.  *** Baseline:  Goal status: INITIAL  4.  *** Baseline:  Goal status: INITIAL  5.  *** Baseline:  Goal status: INITIAL  6.   *** Baseline:  Goal status: INITIAL  LONG TERM GOALS: Target date: ***  *** Baseline:  Goal status: INITIAL  2.  *** Baseline:  Goal status: INITIAL  3.  *** Baseline:  Goal status: INITIAL  4.  *** Baseline:  Goal status: INITIAL  5.  *** Baseline:  Goal status: INITIAL  6.  *** Baseline:  Goal status: INITIAL  PLAN:  PT FREQUENCY: 1-2x/week  PT DURATION: {rehab duration:25117}  PLANNED INTERVENTIONS: Therapeutic exercises, Therapeutic activity, Neuromuscular re-education, Balance training, Gait training, Patient/Family education, Self Care, Joint mobilization, Dry Needling, Electrical stimulation, Spinal mobilization, Cryotherapy, Moist heat, Manual therapy, and Re-evaluation.  PLAN FOR NEXT SESSION: HEP review and update, manual techniques as appropriate, aerobic tasks, ROM and flexibility activities, strengthening and PREs, TPDN, gait and balance training as needed     Hildred Laser, PT 11/28/2022, 6:08 AM
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
## 2022-12-06 ENCOUNTER — Other Ambulatory Visit: Payer: Self-pay

## 2022-12-06 ENCOUNTER — Encounter: Payer: Self-pay | Admitting: Physical Therapy

## 2022-12-06 ENCOUNTER — Ambulatory Visit: Payer: Medicaid Other | Attending: Physician Assistant | Admitting: Physical Therapy

## 2022-12-06 DIAGNOSIS — M5459 Other low back pain: Secondary | ICD-10-CM | POA: Insufficient documentation

## 2022-12-06 DIAGNOSIS — M542 Cervicalgia: Secondary | ICD-10-CM | POA: Diagnosis present

## 2022-12-06 DIAGNOSIS — M6281 Muscle weakness (generalized): Secondary | ICD-10-CM | POA: Insufficient documentation

## 2022-12-06 NOTE — Patient Instructions (Addendum)
Access Code: [email or web address] URL: [email or web address] Date: 12/06/2022 Prepared by: Rosana Hoes  Exercises - Supine Lower Trunk Rotation  - 1 x daily - 10 reps - 5 second hold - Hooklying Clamshell with Resistance  - 1 x daily - 3 sets - 5 reps - Bridge  - 1 x daily - 3 sets - 5 reps - Seated Cervical Sidebending Stretch  - 1 x daily - 3 reps - 15 seconds hold

## 2022-12-07 DIAGNOSIS — E559 Vitamin D deficiency, unspecified: Secondary | ICD-10-CM | POA: Diagnosis not present

## 2022-12-07 DIAGNOSIS — R5383 Other fatigue: Secondary | ICD-10-CM | POA: Diagnosis not present

## 2022-12-07 DIAGNOSIS — Z79899 Other long term (current) drug therapy: Secondary | ICD-10-CM | POA: Diagnosis not present

## 2022-12-07 DIAGNOSIS — M79604 Pain in right leg: Secondary | ICD-10-CM | POA: Diagnosis not present

## 2022-12-07 DIAGNOSIS — Z6822 Body mass index (BMI) 22.0-22.9, adult: Secondary | ICD-10-CM | POA: Diagnosis not present

## 2022-12-07 DIAGNOSIS — E282 Polycystic ovarian syndrome: Secondary | ICD-10-CM | POA: Diagnosis not present

## 2022-12-07 DIAGNOSIS — M545 Low back pain, unspecified: Secondary | ICD-10-CM | POA: Diagnosis not present

## 2022-12-07 DIAGNOSIS — Z3200 Encounter for pregnancy test, result unknown: Secondary | ICD-10-CM | POA: Diagnosis not present

## 2022-12-07 DIAGNOSIS — G43909 Migraine, unspecified, not intractable, without status migrainosus: Secondary | ICD-10-CM | POA: Diagnosis not present

## 2022-12-07 DIAGNOSIS — E785 Hyperlipidemia, unspecified: Secondary | ICD-10-CM | POA: Diagnosis not present

## 2022-12-07 DIAGNOSIS — N946 Dysmenorrhea, unspecified: Secondary | ICD-10-CM | POA: Diagnosis not present

## 2022-12-21 ENCOUNTER — Ambulatory Visit: Payer: Medicaid Other

## 2022-12-28 ENCOUNTER — Ambulatory Visit: Payer: Medicaid Other

## 2023-01-04 ENCOUNTER — Ambulatory Visit: Payer: Medicaid Other

## 2023-01-04 ENCOUNTER — Telehealth: Payer: Self-pay

## 2023-01-04 NOTE — Telephone Encounter (Signed)
PT called patient on 01/03/2023 to confirm her 01/04/2023 appointment.  Also discussed her previous two no-shows with patient stating she did not have an appointment. Reviewed attendance policy.   Eloy End , PT 01/04/23 7:56 AM

## 2023-01-10 DIAGNOSIS — M545 Low back pain, unspecified: Secondary | ICD-10-CM | POA: Diagnosis not present

## 2023-01-10 DIAGNOSIS — Z6822 Body mass index (BMI) 22.0-22.9, adult: Secondary | ICD-10-CM | POA: Diagnosis not present

## 2023-01-10 DIAGNOSIS — Z79899 Other long term (current) drug therapy: Secondary | ICD-10-CM | POA: Diagnosis not present

## 2023-01-10 DIAGNOSIS — Z3200 Encounter for pregnancy test, result unknown: Secondary | ICD-10-CM | POA: Diagnosis not present

## 2023-01-10 DIAGNOSIS — M79604 Pain in right leg: Secondary | ICD-10-CM | POA: Diagnosis not present

## 2023-01-11 ENCOUNTER — Ambulatory Visit: Payer: Medicaid Other | Attending: Physician Assistant | Admitting: Physical Therapy

## 2023-01-11 ENCOUNTER — Other Ambulatory Visit: Payer: Self-pay

## 2023-01-11 ENCOUNTER — Encounter: Payer: Self-pay | Admitting: Physical Therapy

## 2023-01-11 DIAGNOSIS — M5459 Other low back pain: Secondary | ICD-10-CM | POA: Insufficient documentation

## 2023-01-11 DIAGNOSIS — M6281 Muscle weakness (generalized): Secondary | ICD-10-CM | POA: Diagnosis present

## 2023-01-11 DIAGNOSIS — M542 Cervicalgia: Secondary | ICD-10-CM | POA: Insufficient documentation

## 2023-01-11 NOTE — Therapy (Unsigned)
OUTPATIENT PHYSICAL THERAPY TREATMENT NOTE   Patient Name: Brooke Christian MRN: [id number] DOB:09-Jul-1985, 37 y.o., female Today's Date: 01/12/2023   END OF SESSION:  PT End of Session - 01/12/23 0753     Visit Number 2    Number of Visits 9    Date for PT Re-Evaluation 01/31/23    Authorization Type [employer]    PT Start Time 1625    PT Stop Time 1700    PT Time Calculation (min) 35 min    Activity Tolerance Patient limited by pain    Behavior During Therapy --   tearful             History reviewed. No pertinent past medical history. Past Surgical History:  Procedure Laterality Date   TUBAL LIGATION     There are no problems to display for this patient.   PCP: Grayce Sessions, NP  REFERRING PROVIDER: Jamey Reas, PA-C  REFERRING DIAG: Low back pain, unspecified  Rationale for Evaluation and Treatment: Rehabilitation  THERAPY DIAG:  Other low back pain  Cervicalgia  Muscle weakness (generalized)  ONSET DATE: Ongoing 7-8 months   SUBJECTIVE:                                                                                                                                                                                          SUBJECTIVE STATEMENT: Patient report she thinks she slept wrong on her right shoulder and it hurts so bad, she can't remember a specific mechanism that caused her pain. She states her back and neck are feeling pretty good, the exercises have been helpful at home.   PERTINENT HISTORY:  None  PAIN:  Are you having pain? Yes:  NPRS scale: 9/10 Pain location: Right shoulder Pain description: Sharp, constant, throbbing Aggravating factors: Any movement Relieving factors: None  PRECAUTIONS: None  PATIENT GOALS: Pain relief   OBJECTIVE:  PATIENT SURVEYS:  FOTO 48% functional status  MUSCLE LENGTH: Hamstring length limited with patient reporting pain of entire thigh region  POSTURE:   Rounded shoulder and  forward head posture  PALPATION: Generalized tenderness of lumbar paraspinals, right upper trap region  LUMBAR ROM:   AROM eval  Flexion WFL  Extension WFL*  Right lateral flexion WFL*  Left lateral flexion WFL  Right rotation WFL*  Left rotation WFL*   (Blank rows = not tested)  *patient reports low back pain  CERVICAL ROM:   Active ROM A/PROM (deg) eval  Flexion 60  Extension 55*  Right lateral flexion   Left lateral flexion   Right rotation 65*  Left rotation 70   (Blank rows =  not tested)  *patient reports neck pain  LOWER EXTREMITY ROM:      WFL, patient reports pain of entire thigh region with hip PROM  LOWER EXTREMITY MMT:    MMT Right eval Left eval  Hip flexion 4 4  Hip extension 3 3  Hip abduction 3 3  Hip adduction    Hip internal rotation    Hip external rotation    Knee flexion 5 5  Knee extension 5 5  Ankle dorsiflexion    Ankle plantarflexion    Ankle inversion    Ankle eversion     (Blank rows = not tested)  LUMBAR SPECIAL TESTS:  Lumbar and cervical radicular testing negative  FUNCTIONAL TESTS:  Not assessed  GAIT: Assistive device utilized: None Level of assistance: Complete Independence Comments: WFL   TODAY'S TREATMENT:    [facility] Adult PT Treatment:                                                DATE: 01/11/2023 Therapeutic Exercise: UBE L1 x 4 min (fwd/bwd) while taking subjective Supine dowel press to overhead stretch  Seated table slide shoulder flexion Seated shoulder blade squeezes Manual: STM / TPR for right upper trap region  Passive upper trap and levator scap stretch Modalities: MHP to right neck and shoulder x 5 min   [facility] Adult PT Treatment:                                                DATE: 12/06/2022 Therapeutic Exercise: LTR x 10 Hooklying clamshell with red x 5 Bridge x 5 Seated upper trap stretch x 15 sec each  PATIENT EDUCATION:  Education details: HEP update, using modalities at home for pain  releif Person educated: Patient Education method: Explanation, Demonstration, Tactile cues, Verbal cues, and Handouts Education comprehension: verbalized understanding, returned demonstration, verbal cues required, tactile cues required, and needs further education  HOME EXERCISE PROGRAM: Access Code: [email or web address]    ASSESSMENT: CLINICAL IMPRESSION: Patient with poor tolerance to therapy this visit due to new onset of right shoulder pain. Her pain seems to be more upper trap related as she reported exquisite upper trap tenderness and pain with increased muscular tension. She deferred trying TPDN this visit so therapy focused on manual for the right upper trap and passive neck stretching, and working on stretching for the right shoulder, neck, and postural control exercises. She did become tearful while attempting passive and active assisted exercises for the right shoulder so provided patient with MHP which helped to reduce pain. Updated her HEP to include exercises to stretch right shoulder and improve posture. Patient would benefit from continued skilled PT to progress her mobility and strength in order to reduce pain and maximize functional ability.    OBJECTIVE IMPAIRMENTS: decreased activity tolerance, decreased ROM, decreased strength, impaired flexibility, postural dysfunction, and pain.   ACTIVITY LIMITATIONS: carrying, lifting, bending, standing, and locomotion level  PARTICIPATION LIMITATIONS: meal prep, cleaning, laundry, driving, shopping, community activity, and occupation  PERSONAL FACTORS: Fitness, Past/current experiences, and Time since onset of injury/illness/exacerbation are also affecting patient's functional outcome.    GOALS: Goals reviewed with patient? Yes  SHORT TERM GOALS: Target date: 01/03/2023  Patient will be I with  initial HEP in order to progress with therapy. Baseline: HEP provided at eval Goal status: INITIAL  2.  Patient will demonstrate right cervical  rotation >/= 70 deg to indicate reduced muscle tightness and improve her pain level Baseline: 65 deg with report of pain Goal status: INITIAL  3.  Patient will report low back pain </= 3/10 in order to reduce functional limitations Baseline: 6-7/10 pain level Goal status: INITIAL  LONG TERM GOALS: Target date: 01/31/2023  Patient will be I with final HEP to maintain progress from PT. Baseline: HEP provided at eval Goal status: INITIAL  2.  Patient will report >/= 59% status on FOTO to indicate improved functional ability. Baseline: 48% functional status Goal status: INITIAL  3.  Patient will demonstrate gross core and hip strength >/= 4-/5 MMT in order to improve her standing and activity tolerance  Baseline: grossly 3/5 MMT Goal status: INITIAL  4.  Patient will perform lumbar and cervical AROM without report of increased pain to improve ability to perform work related tasks Baseline: pain with lumbar and cervical motion noted above Goal status: INITIAL   PLAN: PT FREQUENCY: 1x/week  PT DURATION: 8 weeks  PLANNED INTERVENTIONS: Therapeutic exercises, Therapeutic activity, Neuromuscular re-education, Balance training, Gait training, Patient/Family education, Self Care, Joint mobilization, Joint manipulation, Dry Needling, Electrical stimulation, Spinal manipulation, Spinal mobilization, Cryotherapy, Moist heat, Manual therapy, and Re-evaluation.  PLAN FOR NEXT SESSION: Review HEP and progress PRN, manual/TPDN for right upper trap region, progress mobility and stretching as tolerated, progress core/hip/postural strengthening as tolerated   Rosana Hoes, PT, DPT, LAT, ATC 01/12/23  7:59 AM Phone: [phone] Fax: [phone]

## 2023-01-11 NOTE — Patient Instructions (Signed)
Access Code: [email or web address] URL: [email or web address] Date: 01/11/2023 Prepared by: Rosana Hoes  Exercises - Supine Lower Trunk Rotation  - 1 x daily - 10 reps - 5 second hold - Hooklying Clamshell with Resistance  - 1 x daily - 3 sets - 5 reps - Bridge  - 1 x daily - 3 sets - 5 reps - Seated Cervical Sidebending Stretch  - 1 x daily - 3 reps - 15 seconds hold - Seated Scapular Retraction  - 1 x daily - 10 reps - 5 seconds hold - Seated Shoulder Flexion Towel Slide at Table Top  - 1 x daily - 10 reps - 5 secnds hold

## 2023-01-12 ENCOUNTER — Encounter: Payer: Self-pay | Admitting: Physical Therapy

## 2023-01-16 DIAGNOSIS — Z79899 Other long term (current) drug therapy: Secondary | ICD-10-CM | POA: Diagnosis not present

## 2023-01-18 DIAGNOSIS — Z23 Encounter for immunization: Secondary | ICD-10-CM | POA: Diagnosis not present

## 2023-01-19 ENCOUNTER — Ambulatory Visit: Payer: Medicaid Other

## 2023-01-19 DIAGNOSIS — M542 Cervicalgia: Secondary | ICD-10-CM

## 2023-01-19 DIAGNOSIS — M5459 Other low back pain: Secondary | ICD-10-CM | POA: Diagnosis not present

## 2023-01-19 DIAGNOSIS — M6281 Muscle weakness (generalized): Secondary | ICD-10-CM

## 2023-01-19 NOTE — Therapy (Signed)
OUTPATIENT PHYSICAL THERAPY TREATMENT NOTE   Patient Name: Brooke Christian MRN: [id number] DOB:11-07-1985, 37 y.o., female Today's Date: 01/19/2023   END OF SESSION:  PT End of Session - 01/19/23 1456     Visit Number 3    Number of Visits 9    Date for PT Re-Evaluation 01/31/23    Authorization Type [employer]    PT Start Time 1456   arrived late   PT Stop Time 1526    PT Time Calculation (min) 30 min    Activity Tolerance Patient limited by pain    Behavior During Therapy --   tearful              History reviewed. No pertinent past medical history. Past Surgical History:  Procedure Laterality Date   TUBAL LIGATION     There are no problems to display for this patient.   PCP: Grayce Sessions, NP  REFERRING PROVIDER: Jamey Reas, PA-C  REFERRING DIAG: Low back pain, unspecified  Rationale for Evaluation and Treatment: Rehabilitation  THERAPY DIAG:  Other low back pain  Cervicalgia  Muscle weakness (generalized)  ONSET DATE: Ongoing 7-8 months   SUBJECTIVE:                                                                                                                                                                                          SUBJECTIVE STATEMENT: Pt presents to PT with reports of continued neck and R shoulder pain. Notes compliance with HEP.   PERTINENT HISTORY:  None  PAIN:  Are you having pain? Yes:  NPRS scale: 9/10 Pain location: Right shoulder Pain description: Sharp, constant, throbbing Aggravating factors: Any movement Relieving factors: None  PRECAUTIONS: None  PATIENT GOALS: Pain relief   OBJECTIVE:  PATIENT SURVEYS:  FOTO 48% functional status  MUSCLE LENGTH: Hamstring length limited with patient reporting pain of entire thigh region  POSTURE:   Rounded shoulder and forward head posture  PALPATION: Generalized tenderness of lumbar paraspinals, right upper trap region  LUMBAR ROM:   AROM eval   Flexion WFL  Extension WFL*  Right lateral flexion WFL*  Left lateral flexion WFL  Right rotation WFL*  Left rotation WFL*   (Blank rows = not tested)  *patient reports low back pain  CERVICAL ROM:   Active ROM A/PROM (deg) eval  Flexion 60  Extension 55*  Right lateral flexion   Left lateral flexion   Right rotation 65*  Left rotation 70   (Blank rows = not tested)  *patient reports neck pain  LOWER EXTREMITY ROM:      WFL, patient reports pain of  entire thigh region with hip PROM  LOWER EXTREMITY MMT:    MMT Right eval Left eval  Hip flexion 4 4  Hip extension 3 3  Hip abduction 3 3  Hip adduction    Hip internal rotation    Hip external rotation    Knee flexion 5 5  Knee extension 5 5  Ankle dorsiflexion    Ankle plantarflexion    Ankle inversion    Ankle eversion     (Blank rows = not tested)  LUMBAR SPECIAL TESTS:  Lumbar and cervical radicular testing negative  FUNCTIONAL TESTS:  Not assessed  GAIT: Assistive device utilized: None Level of assistance: Complete Independence Comments: WFL   TODAY'S TREATMENT:    [facility] Adult PT Treatment:                                                DATE: 01/19/2023 Therapeutic Exercise: Supine dow flexion to 90 degrees x 10 Supine dow chest press x 10 Supine horizontal abd x 15 YTB Manual: STM / TPR for right upper trap region  Positional release to right upper trap Suboccipital release Modalities: MHP to right neck and shoulder x 10 min post session  [facility] Adult PT Treatment:                                                DATE: 01/11/2023 Therapeutic Exercise: UBE L1 x 4 min (fwd/bwd) while taking subjective Supine dowel press to overhead stretch  Seated table slide shoulder flexion Seated shoulder blade squeezes Manual: STM / TPR for right upper trap region  Passive upper trap and levator scap stretch Modalities: MHP to right neck and shoulder x 5 min   [facility] Adult PT Treatment:                                                 DATE: 12/06/2022 Therapeutic Exercise: LTR x 10 Hooklying clamshell with red x 5 Bridge x 5 Seated upper trap stretch x 15 sec each  PATIENT EDUCATION:  Education details: HEP update, using modalities at home for pain releif Person educated: Patient Education method: Explanation, Demonstration, Tactile cues, Verbal cues, and Handouts Education comprehension: verbalized understanding, returned demonstration, verbal cues required, tactile cues required, and needs further education  HOME EXERCISE PROGRAM: Access Code: [email or web address]    ASSESSMENT: CLINICAL IMPRESSION: Pt tolerated treatment fair but was limited by severe R shoulder pain. Therapy focused on manual for neck and R shoulder in order to decrease pain and improve function. Questionable therapeutic benefit to this point, will currently continue to progress as able.    OBJECTIVE IMPAIRMENTS: decreased activity tolerance, decreased ROM, decreased strength, impaired flexibility, postural dysfunction, and pain.   ACTIVITY LIMITATIONS: carrying, lifting, bending, standing, and locomotion level  PARTICIPATION LIMITATIONS: meal prep, cleaning, laundry, driving, shopping, community activity, and occupation  PERSONAL FACTORS: Fitness, Past/current experiences, and Time since onset of injury/illness/exacerbation are also affecting patient's functional outcome.    GOALS: Goals reviewed with patient? Yes  SHORT TERM GOALS: Target date: 01/03/2023  Patient will be I with initial HEP in order  to progress with therapy. Baseline: HEP provided at eval Goal status: INITIAL  2.  Patient will demonstrate right cervical rotation >/= 70 deg to indicate reduced muscle tightness and improve her pain level Baseline: 65 deg with report of pain Goal status: INITIAL  3.  Patient will report low back pain </= 3/10 in order to reduce functional limitations Baseline: 6-7/10 pain level Goal status: INITIAL  LONG TERM GOALS:  Target date: 01/31/2023  Patient will be I with final HEP to maintain progress from PT. Baseline: HEP provided at eval Goal status: INITIAL  2.  Patient will report >/= 59% status on FOTO to indicate improved functional ability. Baseline: 48% functional status Goal status: INITIAL  3.  Patient will demonstrate gross core and hip strength >/= 4-/5 MMT in order to improve her standing and activity tolerance  Baseline: grossly 3/5 MMT Goal status: INITIAL  4.  Patient will perform lumbar and cervical AROM without report of increased pain to improve ability to perform work related tasks Baseline: pain with lumbar and cervical motion noted above Goal status: INITIAL   PLAN: PT FREQUENCY: 1x/week  PT DURATION: 8 weeks  PLANNED INTERVENTIONS: Therapeutic exercises, Therapeutic activity, Neuromuscular re-education, Balance training, Gait training, Patient/Family education, Self Care, Joint mobilization, Joint manipulation, Dry Needling, Electrical stimulation, Spinal manipulation, Spinal mobilization, Cryotherapy, Moist heat, Manual therapy, and Re-evaluation.  PLAN FOR NEXT SESSION: Review HEP and progress PRN, manual/TPDN for right upper trap region, progress mobility and stretching as tolerated, progress core/hip/postural strengthening as tolerated   Eloy End PT  01/19/23 3:43 PM

## 2023-01-23 DIAGNOSIS — Z23 Encounter for immunization: Secondary | ICD-10-CM | POA: Diagnosis not present

## 2023-01-30 ENCOUNTER — Other Ambulatory Visit: Payer: Self-pay

## 2023-01-30 ENCOUNTER — Ambulatory Visit: Payer: Medicaid Other | Admitting: Physical Therapy

## 2023-01-30 ENCOUNTER — Encounter: Payer: Self-pay | Admitting: Physical Therapy

## 2023-01-30 DIAGNOSIS — M5459 Other low back pain: Secondary | ICD-10-CM

## 2023-01-30 DIAGNOSIS — M6281 Muscle weakness (generalized): Secondary | ICD-10-CM

## 2023-01-30 DIAGNOSIS — M542 Cervicalgia: Secondary | ICD-10-CM

## 2023-01-30 NOTE — Therapy (Signed)
OUTPATIENT PHYSICAL THERAPY TREATMENT NOTE   Patient Name: Brooke Christian MRN: [id number] DOB:1985-09-12, 37 y.o., female Today's Date: 01/30/2023   END OF SESSION:  PT End of Session - 01/30/23 0813     Visit Number 4    Number of Visits 12    Date for PT Re-Evaluation 03/27/23    Authorization Type [employer]    PT Start Time 0807    PT Stop Time 0855    PT Time Calculation (min) 48 min    Activity Tolerance Patient limited by pain    Behavior During Therapy [facility] for tasks assessed/performed                History reviewed. No pertinent past medical history. Past Surgical History:  Procedure Laterality Date   TUBAL LIGATION     There are no problems to display for this patient.   PCP: Grayce Sessions, NP  REFERRING PROVIDER: Jamey Reas, PA-C  REFERRING DIAG: Low back pain, unspecified  Rationale for Evaluation and Treatment: Rehabilitation  THERAPY DIAG:  Cervicalgia  Other low back pain  Muscle weakness (generalized)  ONSET DATE: Ongoing 7-8 months   SUBJECTIVE:                                                                                                                                                                                          SUBJECTIVE STATEMENT: Patient reports her right shoulder is doing much better but when she does movements such as reach across or lying on the right side it will really ache.    PERTINENT HISTORY:  None  PAIN:  Are you having pain? Yes:  NPRS scale: 6/10 Pain location: Right shoulder Pain description: Aching Aggravating factors: Any movement Relieving factors: None  PRECAUTIONS: None  PATIENT GOALS: Pain relief   OBJECTIVE:  PATIENT SURVEYS:  FOTO 48% functional status  MUSCLE LENGTH: Hamstring length limited with patient reporting pain of entire thigh region  POSTURE:   Rounded shoulder and forward head posture  PALPATION: Generalized tenderness of lumbar paraspinals, right  upper trap region  LUMBAR ROM:   AROM eval  Flexion WFL  Extension WFL*  Right lateral flexion WFL*  Left lateral flexion WFL  Right rotation WFL*  Left rotation WFL*   (Blank rows = not tested)  *patient reports low back pain  CERVICAL ROM:   Active ROM A/PROM (deg) eval   01/30/2023  Flexion 60   Extension 55*   Right lateral flexion    Left lateral flexion    Right rotation 65* 65  Left rotation 70    (Blank rows = not tested)  *  patient reports neck pain  LOWER EXTREMITY ROM:      WFL, patient reports pain of entire thigh region with hip PROM  LOWER EXTREMITY MMT:    MMT Right eval Left eval  Hip flexion 4 4  Hip extension 3 3  Hip abduction 3 3  Hip adduction    Hip internal rotation    Hip external rotation    Knee flexion 5 5  Knee extension 5 5  Ankle dorsiflexion    Ankle plantarflexion    Ankle inversion    Ankle eversion     (Blank rows = not tested)  LUMBAR SPECIAL TESTS:  Lumbar and cervical radicular testing negative  FUNCTIONAL TESTS:  Not assessed  GAIT: Assistive device utilized: None Level of assistance: Complete Independence Comments: WFL   TODAY'S TREATMENT:    [facility] Adult PT Treatment:                                                DATE: 01/30/2023 Therapeutic Exercise: UBE L1 x 4 min (fwd) while taking subjective Supine dowel press to overhead stretch 1o x 10 sec Sidelying shoulder abduction x 10 Seated table slide shoulder flexion 10 x 10 sec Row with green 2 x 10 Manual: STM / TPR for right upper trap region  Suboccipital release with gentle manual traction Passive upper trap and levator scap stretch Sidelying scapular mobs all directions Modalities: Electrical Stimulation with MHP Location: Right upper trap region Action: Premod Parameters: 80-150 with intensity to patient tolerance x 10 min Goals: Pain relief and Reduced muscle tension    [facility] Adult PT Treatment:                                                 DATE: 01/19/2023 Therapeutic Exercise: Supine dow flexion to 90 degrees x 10 Supine dow chest press x 10 Supine horizontal abd x 15 YTB Manual: STM / TPR for right upper trap region  Positional release to right upper trap Suboccipital release Modalities: MHP to right neck and shoulder x 10 min post session  [facility] Adult PT Treatment:                                                DATE: 01/11/2023 Therapeutic Exercise: UBE L1 x 4 min (fwd/bwd) while taking subjective Supine dowel press to overhead stretch  Seated table slide shoulder flexion Seated shoulder blade squeezes Manual: STM / TPR for right upper trap region  Passive upper trap and levator scap stretch Modalities: MHP to right neck and shoulder x 5 min  PATIENT EDUCATION:  Education details: POC extension, HEP, TENS for pain relief Person educated: Patient Education method: Explanation, Demonstration, Tactile cues, Verbal cues Education comprehension: verbalized understanding, returned demonstration, verbal cues required, tactile cues required, and needs further education  HOME EXERCISE PROGRAM: Access Code: [email or web address]    ASSESSMENT: CLINICAL IMPRESSION: Patient with better tolerance for therapy with no adverse effects. Therapy continues to utilize primarily manual to reduce right neck and shoulder pain, tightness, and improve her motion. She demonstrates continued limitation with right cervical rotation but  has improved over past few visits in her pain and shoulder function. She was able to tolerate progression in postural strengthening this visit. Utilized e-stim combined with MHP post session for pain control and to reduce muscle tension. Patient would benefit from continued skilled PT to progress her mobility and strength in order to reduce pain and maximize functional ability, so will extend PT POC for 8 more weeks.    OBJECTIVE IMPAIRMENTS: decreased activity tolerance, decreased ROM, decreased strength, impaired  flexibility, postural dysfunction, and pain.   ACTIVITY LIMITATIONS: carrying, lifting, bending, standing, and locomotion level  PARTICIPATION LIMITATIONS: meal prep, cleaning, laundry, driving, shopping, community activity, and occupation  PERSONAL FACTORS: Fitness, Past/current experiences, and Time since onset of injury/illness/exacerbation are also affecting patient's functional outcome.    GOALS: Goals reviewed with patient? Yes  SHORT TERM GOALS: Target date: 01/03/2023  Patient will be I with initial HEP in order to progress with therapy. Baseline: HEP provided at eval 01/30/2023: progressing Goal status: ONGOING  2.  Patient will demonstrate right cervical rotation >/= 70 deg to indicate reduced muscle tightness and improve her pain level Baseline: 65 deg with report of pain 01/30/2023: 65 deg Goal status: ONGOING  3.  Patient will report low back pain </= 3/10 in order to reduce functional limitations Baseline: 6-7/10 pain level 01/30/2023: 6/10  Goal status: ONGOING  LONG TERM GOALS: Target date: 03/27/2023  Patient will be I with final HEP to maintain progress from PT. Baseline: HEP provided at eval 01/30/2023: progressing Goal status: ONGOING  2.  Patient will report >/= 59% status on FOTO to indicate improved functional ability. Baseline: 48% functional status 01/30/2023: not assessed Goal status: ONGOING  3.  Patient will demonstrate gross core and hip strength >/= 4-/5 MMT in order to improve her standing and activity tolerance  Baseline: grossly 3/5 MMT 01/30/2023: not assessed Goal status: ONGOING  4.  Patient will perform lumbar and cervical AROM without report of increased pain to improve ability to perform work related tasks Baseline: pain with lumbar and cervical motion noted above 01/30/2023: continues to be limited an with pain Goal status: ONGOING   PLAN: PT FREQUENCY: 1x/week  PT DURATION: 8 weeks  PLANNED INTERVENTIONS: Therapeutic exercises,  Therapeutic activity, Neuromuscular re-education, Balance training, Gait training, Patient/Family education, Self Care, Joint mobilization, Joint manipulation, Dry Needling, Electrical stimulation, Spinal manipulation, Spinal mobilization, Cryotherapy, Moist heat, Manual therapy, and Re-evaluation.  PLAN FOR NEXT SESSION: Review HEP and progress PRN, manual/TPDN for right upper trap region, progress mobility and stretching as tolerated, progress core/hip/postural strengthening as tolerated   Rosana Hoes, PT, DPT, LAT, ATC 01/30/23  10:36 AM Phone: [phone] Fax: [phone]

## 2023-02-07 DIAGNOSIS — R0602 Shortness of breath: Secondary | ICD-10-CM | POA: Diagnosis not present

## 2023-02-07 DIAGNOSIS — G43909 Migraine, unspecified, not intractable, without status migrainosus: Secondary | ICD-10-CM | POA: Diagnosis not present

## 2023-02-07 DIAGNOSIS — Z6822 Body mass index (BMI) 22.0-22.9, adult: Secondary | ICD-10-CM | POA: Diagnosis not present

## 2023-02-07 DIAGNOSIS — Z79899 Other long term (current) drug therapy: Secondary | ICD-10-CM | POA: Diagnosis not present

## 2023-02-07 DIAGNOSIS — E282 Polycystic ovarian syndrome: Secondary | ICD-10-CM | POA: Diagnosis not present

## 2023-02-07 DIAGNOSIS — R030 Elevated blood-pressure reading, without diagnosis of hypertension: Secondary | ICD-10-CM | POA: Diagnosis not present

## 2023-02-07 DIAGNOSIS — F419 Anxiety disorder, unspecified: Secondary | ICD-10-CM | POA: Diagnosis not present

## 2023-02-07 DIAGNOSIS — Z3200 Encounter for pregnancy test, result unknown: Secondary | ICD-10-CM | POA: Diagnosis not present

## 2023-02-07 DIAGNOSIS — M545 Low back pain, unspecified: Secondary | ICD-10-CM | POA: Diagnosis not present

## 2023-02-07 DIAGNOSIS — F32A Depression, unspecified: Secondary | ICD-10-CM | POA: Diagnosis not present

## 2023-02-07 DIAGNOSIS — M79604 Pain in right leg: Secondary | ICD-10-CM | POA: Diagnosis not present

## 2023-02-07 DIAGNOSIS — N946 Dysmenorrhea, unspecified: Secondary | ICD-10-CM | POA: Diagnosis not present

## 2023-02-08 ENCOUNTER — Ambulatory Visit: Payer: Medicaid Other | Attending: Physician Assistant

## 2023-02-08 DIAGNOSIS — M6281 Muscle weakness (generalized): Secondary | ICD-10-CM | POA: Diagnosis present

## 2023-02-08 DIAGNOSIS — M5459 Other low back pain: Secondary | ICD-10-CM | POA: Diagnosis present

## 2023-02-08 DIAGNOSIS — M542 Cervicalgia: Secondary | ICD-10-CM | POA: Insufficient documentation

## 2023-02-08 NOTE — Therapy (Addendum)
 OUTPATIENT PHYSICAL THERAPY TREATMENT NOTE  discharge   Patient Name: Brooke Christian MRN: [id number] DOB:1985-12-11, 37 y.o., female Today's Date: 02/08/2023   END OF SESSION:  PT End of Session - 02/08/23 1021     Visit Number 5    Number of Visits 12    Date for PT Re-Evaluation 03/27/23    Authorization Type [employer]    PT Start Time 1021    PT Stop Time 1059    PT Time Calculation (min) 38 min    Activity Tolerance Patient limited by pain    Behavior During Therapy [facility] for tasks assessed/performed                 History reviewed. No pertinent past medical history. Past Surgical History:  Procedure Laterality Date   TUBAL LIGATION     There are no problems to display for this patient.   PCP: Grayce Sessions, NP  REFERRING PROVIDER: Jamey Reas, PA-C  REFERRING DIAG: Low back pain, unspecified  Rationale for Evaluation and Treatment: Rehabilitation  THERAPY DIAG:  Cervicalgia  Other low back pain  Muscle weakness (generalized)  ONSET DATE: Ongoing 7-8 months   SUBJECTIVE:                                                                                                                                                                                          SUBJECTIVE STATEMENT: Pt presents to PT with reports of continued pain and discomfort in R shoulder. Has continued HEP compliant with no adverse effect.   PERTINENT HISTORY:  None  PAIN:  Are you having pain? Yes:  NPRS scale: 6/10 Pain location: Right shoulder Pain description: Aching Aggravating factors: Any movement Relieving factors: None  PRECAUTIONS: None  PATIENT GOALS: Pain relief   OBJECTIVE:  PATIENT SURVEYS:  FOTO 48% functional status  MUSCLE LENGTH: Hamstring length limited with patient reporting pain of entire thigh region  POSTURE:   Rounded shoulder and forward head posture  PALPATION: Generalized tenderness of lumbar paraspinals, right upper  trap region  LUMBAR ROM:   AROM eval  Flexion WFL  Extension WFL*  Right lateral flexion WFL*  Left lateral flexion WFL  Right rotation WFL*  Left rotation WFL*   (Blank rows = not tested)  *patient reports low back pain  CERVICAL ROM:   Active ROM A/PROM (deg) eval   01/30/2023  Flexion 60   Extension 55*   Right lateral flexion    Left lateral flexion    Right rotation 65* 65  Left rotation 70    (Blank rows = not tested)  *patient reports neck pain  LOWER EXTREMITY ROM:      WFL, patient reports pain of entire thigh region with hip PROM  LOWER EXTREMITY MMT:    MMT Right eval Left eval  Hip flexion 4 4  Hip extension 3 3  Hip abduction 3 3  Hip adduction    Hip internal rotation    Hip external rotation    Knee flexion 5 5  Knee extension 5 5  Ankle dorsiflexion    Ankle plantarflexion    Ankle inversion    Ankle eversion     (Blank rows = not tested)  LUMBAR SPECIAL TESTS:  Lumbar and cervical radicular testing negative  FUNCTIONAL TESTS:  Not assessed  GAIT: Assistive device utilized: None Level of assistance: Complete Independence Comments: WFL   TODAY'S TREATMENT:    [facility] Adult PT Treatment:                                                DATE: 02/08/2023 Therapeutic Exercise: Pulleys into shoulder flexion x 2 min Row 2x10 GTB Shoulder ext 2x10 RTB Supine dowel press to overhead stretch 10 x 5 sec Supine horizontal abd 2x10 YTB Manual Therapy: STM / TPR for right upper trap region  Suboccipital release with gentle manual traction Positional release to R upper trap Modalities: MHP to cervical paraspinals x 10 min post session  [facility] Adult PT Treatment:                                                DATE: 01/30/2023 Therapeutic Exercise: UBE L1 x 4 min (fwd) while taking subjective Supine dowel press to overhead stretch 1o x 10 sec Sidelying shoulder abduction x 10 Seated table slide shoulder flexion 10 x 10 sec Row with green 2 x  10 Manual: STM / TPR for right upper trap region  Suboccipital release with gentle manual traction Passive upper trap and levator scap stretch Sidelying scapular mobs all directions Modalities: Electrical Stimulation with MHP Location: Right upper trap region Action: Premod Parameters: 80-150 with intensity to patient tolerance x 10 min Goals: Pain relief and Reduced muscle tension    [facility] Adult PT Treatment:                                                DATE: 01/19/2023 Therapeutic Exercise: Supine dow flexion to 90 degrees x 10 Supine dow chest press x 10 Supine horizontal abd x 15 YTB Manual: STM / TPR for right upper trap region  Positional release to right upper trap Suboccipital release Modalities: MHP to right neck and shoulder x 10 min post session  [facility] Adult PT Treatment:                                                DATE: 01/11/2023 Therapeutic Exercise: UBE L1 x 4 min (fwd/bwd) while taking subjective Supine dowel press to overhead stretch  Seated table slide shoulder flexion Seated shoulder blade squeezes Manual: STM / TPR  for right upper trap region  Passive upper trap and levator scap stretch Modalities: MHP to right neck and shoulder x 5 min  PATIENT EDUCATION:  Education details: POC extension, HEP, TENS for pain relief Person educated: Patient Education method: Explanation, Demonstration, Tactile cues, Verbal cues Education comprehension: verbalized understanding, returned demonstration, verbal cues required, tactile cues required, and needs further education  HOME EXERCISE PROGRAM: Access Code: [email or web address]    ASSESSMENT: CLINICAL IMPRESSION: Pt was able to complete prescribed exercises with no adverse effect. Therapy focused on improving periscapular strength for decreasing pain and improving comfort. Continues to benefit from skilled therapy, will continue per POC.    OBJECTIVE IMPAIRMENTS: decreased activity tolerance, decreased ROM, decreased  strength, impaired flexibility, postural dysfunction, and pain.   ACTIVITY LIMITATIONS: carrying, lifting, bending, standing, and locomotion level  PARTICIPATION LIMITATIONS: meal prep, cleaning, laundry, driving, shopping, community activity, and occupation  PERSONAL FACTORS: Fitness, Past/current experiences, and Time since onset of injury/illness/exacerbation are also affecting patient's functional outcome.    GOALS: Goals reviewed with patient? Yes  SHORT TERM GOALS: Target date: 01/03/2023  Patient will be I with initial HEP in order to progress with therapy. Baseline: HEP provided at eval 01/30/2023: progressing Goal status: ONGOING  2.  Patient will demonstrate right cervical rotation >/= 70 deg to indicate reduced muscle tightness and improve her pain level Baseline: 65 deg with report of pain 01/30/2023: 65 deg Goal status: ONGOING  3.  Patient will report low back pain </= 3/10 in order to reduce functional limitations Baseline: 6-7/10 pain level 01/30/2023: 6/10  Goal status: ONGOING  LONG TERM GOALS: Target date: 03/27/2023  Patient will be I with final HEP to maintain progress from PT. Baseline: HEP provided at eval 01/30/2023: progressing Goal status: ONGOING  2.  Patient will report >/= 59% status on FOTO to indicate improved functional ability. Baseline: 48% functional status 01/30/2023: not assessed Goal status: ONGOING  3.  Patient will demonstrate gross core and hip strength >/= 4-/5 MMT in order to improve her standing and activity tolerance  Baseline: grossly 3/5 MMT 01/30/2023: not assessed Goal status: ONGOING  4.  Patient will perform lumbar and cervical AROM without report of increased pain to improve ability to perform work related tasks Baseline: pain with lumbar and cervical motion noted above 01/30/2023: continues to be limited an with pain Goal status: ONGOING   PLAN: PT FREQUENCY: 1x/week  PT DURATION: 8 weeks  PLANNED INTERVENTIONS:  Therapeutic exercises, Therapeutic activity, Neuromuscular re-education, Balance training, Gait training, Patient/Family education, Self Care, Joint mobilization, Joint manipulation, Dry Needling, Electrical stimulation, Spinal manipulation, Spinal mobilization, Cryotherapy, Moist heat, Manual therapy, and Re-evaluation.  PLAN FOR NEXT SESSION: Review HEP and progress PRN, manual/TPDN for right upper trap region, progress mobility and stretching as tolerated, progress core/hip/postural strengthening as tolerated   Eloy End PT  02/08/23 10:59 AM    PHYSICAL THERAPY DISCHARGE SUMMARY  Visits from Start of Care: 5  Current functional level related to goals / functional outcomes: See above   Remaining deficits: See above   Education / Equipment: HEP   Patient agrees to discharge. Patient goals were not met. Patient is being discharged due to not returning since the last visit.  Rosana Hoes, PT, DPT, LAT, ATC 07/12/23  3:44 PM Phone: [phone] Fax: [phone]

## 2023-03-06 NOTE — Progress Notes (Unsigned)
Primary neurologist: Dr. Delena Bali PCP: Grayce Sessions, NP    CC:  headaches No chief complaint on file.    History provided from self  Follow-up visit:  Prior visit: 11/18/2021 with Dr. Delena Bali (initial consult visit)   Brief HPI:   Brooke Christian is a 37 y.o. 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  At prior visit, recommended trial of sumatriptan as needed for migraine as no benefit with rizatriptan  Interval history:  Reports migraines about the same as prior visit, continues to occur around menstrual cycle and can last up to 3-4 days. Denies any benefit with use of sumatriptan ***. Will use zofran for nausea with benefit.     Headache History: Onset: April 2023 Triggers: menstrual cycle Aura: no Location: retro-orbital, occiput Associated Symptoms:  Photophobia: yes  Phonophobia: yes  Nausea: yes Vomiting: yes Worse with activity?: yes Duration of headaches: 3-4 days  Headache days per month: 4 Headache free days per month: 26  Current Treatment: Abortive Rizatriptan Zofran  Preventative none  Prior Therapies                                 Baclofen Zofran Excedrin Toradol Rizatriptan - not effective Sumatriptan 100mg  - ***    LABS: CBC    Component Value Date/Time   WBC 9.4 04/20/2022 1109   RBC 3.94 04/20/2022 1109   HGB 12.5 04/20/2022 1109   HGB 12.5 01/10/2022 1002   HCT 37.3 04/20/2022 1109   HCT 37.4 01/10/2022 1002   PLT 428 (H) 04/20/2022 1109   PLT 315 01/10/2022 1002   MCV 94.7 04/20/2022 1109   MCV 94 01/10/2022 1002   MCH 31.7 04/20/2022 1109   MCHC 33.5 04/20/2022 1109   RDW 12.6 04/20/2022 1109   RDW 11.8 01/10/2022 1002   LYMPHSABS 2.2 01/10/2022 1002   EOSABS 0.7 (H) 01/10/2022 1002   BASOSABS 0.1 01/10/2022 1002      Latest Ref Rng & Units 04/20/2022   11:09 AM 01/10/2022   10:02 AM 08/15/2007    4:28 PM  CMP  Glucose 70 - 99 mg/dL 161  80  83    BUN 6 - 20 mg/dL 12  7  <3   Creatinine 0.44 - 1.00 mg/dL 0.96  0.45  0.5   Sodium 135 - 145 mmol/L 138  139  137   Potassium 3.5 - 5.1 mmol/L 3.5  4.5  3.6   Chloride 98 - 111 mmol/L 108  104  105   CO2 22 - 32 mmol/L 20  20    Calcium 8.9 - 10.3 mg/dL 9.6  9.5    Total Protein 6.5 - 8.1 g/dL 8.2  7.3    Total Bilirubin 0.3 - 1.2 mg/dL 0.3  0.4    Alkaline Phos 38 - 126 U/L 48  59    AST 15 - 41 U/L 21  17    ALT 0 - 44 U/L 15  14       IMAGING:  none   Current Outpatient Medications on File Prior to Visit  Medication Sig Dispense Refill   albuterol (VENTOLIN HFA) 108 (90 Base) MCG/ACT inhaler Inhale 1-2 puffs into the lungs every 4 (four) hours as needed for wheezing or shortness of breath. 1 each 0   levothyroxine (SYNTHROID) 50 MCG tablet Take 1 tablet (50 mcg total) by mouth daily. 90 tablet  0   montelukast (SINGULAIR) 10 MG tablet Take 1 tablet (10 mg total) by mouth at bedtime. 30 tablet 2   ondansetron (ZOFRAN-ODT) 4 MG disintegrating tablet Take 1 tablet (4 mg total) by mouth every 8 (eight) hours as needed for nausea or vomiting. 20 tablet 5   SUMAtriptan (IMITREX) 100 MG tablet Take 1 tablet (100 mg total) by mouth once as needed for up to 1 dose for migraine. May repeat in 2 hours if headache persists or recurs. 10 tablet 5   No current facility-administered medications on file prior to visit.     Allergies: Allergies  Allergen Reactions   Naproxen Nausea And Vomiting    Family History: Migraine or other headaches in the family:  no Aneurysms in a first degree relative:  no Brain tumors in the family:  no Other neurological illness in the family:   no  Past Medical History: No past medical history on file.  Past Surgical History Past Surgical History:  Procedure Laterality Date   TUBAL LIGATION      Social History: Social History   Tobacco Use   Smoking status: Every Day    Types: Cigarettes   Smokeless tobacco: Never  Vaping Use   Vaping  status: Never Used  Substance Use Topics   Alcohol use: No   Drug use: Yes    Types: Marijuana     ROS: Negative for fevers, chills. Positive for headaches. All other systems reviewed and negative unless stated otherwise in HPI.   Physical Exam:   Vital Signs: There were no vitals taken for this visit. GENERAL: well appearing,in no acute distress,alert SKIN:  Color, texture, turgor normal. No rashes or lesions HEAD:  Normocephalic/atraumatic. CV:  RRR RESP: Normal respiratory effort MSK: no tenderness to palpation over occiput, neck, or shoulders  NEUROLOGICAL: Mental Status: Alert, oriented to person, place and time,Follows commands Cranial Nerves: PERRL, visual fields intact to confrontation, extraocular movements intact, decreased sensation over left V2, no facial droop or ptosis, hearing grossly intact, no dysarthria Motor: muscle strength 5/5 both upper and lower extremities,no drift, normal tone Reflexes: 2+ throughout Sensation: decreased sensation to light touch over LLE Coordination: Finger-to- nose-finger intact bilaterally,Heel-to-shin intact bilaterally Gait: normal-based   IMPRESSION: 37 year old female with a history of asthma who presents for follow-up of worsening headaches.  Initially seen by Dr. Delena Bali on 11/18/2021.  MRI brain unremarkable.  Felt headaches consistent with menstrual migraine.  Use of rizatriptan without benefit.     PLAN:  -Start sumatriptan 100mg  PRN for migraine rescue.  If no benefit or unable to tolerate, will then recommend proceeding with naratriptan. Insurance requiring trial of both sumatriptan and rizatriptan prior to naratriptan coverage.  -continue Zofran as needed for nausea associated with migraine -refill provided -Advised to call with any worsening headaches or change in headache characteristics   Follow-up in 6 months or call earlier if needed   I spent 16 minutes of face-to-face and non-face-to-face time with patient.   This included previsit chart review, lab review, study review, order entry, electronic health record documentation, patient education and discussion regarding above diagnoses and treatment plan and answered all the questions to patient's satisfaction  Ihor Austin, [facility]  [facility] [address] [place], [place] [address]  Phone [phone] Fax [phone] Note: This document was prepared with digital dictation and possible smart phrase technology. Any transcriptional errors that result from this process are unintentional.

## 2023-03-07 DIAGNOSIS — Z79899 Other long term (current) drug therapy: Secondary | ICD-10-CM | POA: Diagnosis not present

## 2023-03-08 ENCOUNTER — Encounter: Payer: Self-pay | Admitting: Adult Health

## 2023-03-08 ENCOUNTER — Ambulatory Visit (INDEPENDENT_AMBULATORY_CARE_PROVIDER_SITE_OTHER): Payer: Medicaid Other | Admitting: Adult Health

## 2023-03-08 VITALS — BP 95/72 | HR 72 | Ht 68.0 in | Wt 143.8 lb

## 2023-03-08 DIAGNOSIS — G43829 Menstrual migraine, not intractable, without status migrainosus: Secondary | ICD-10-CM | POA: Diagnosis not present

## 2023-03-08 MED ORDER — SUMATRIPTAN SUCCINATE 100 MG PO TABS: 100.0000 mg | ORAL_TABLET | Freq: Once | ORAL | 11 refills | Status: DC | PRN

## 2023-03-08 MED ORDER — ZOLMITRIPTAN 2.5 MG PO TABS: ORAL_TABLET | ORAL | 11 refills | Status: DC

## 2023-03-08 NOTE — Patient Instructions (Addendum)
Your Plan:  Recommend trying Zomig 2.5mg  twice daily starting 2 days prior to start on menses and continue for 5 days after (for total of 7 days). If you are unable to tolerate Zomig or had better benefit with sumatriptan, please let me know  Continue sumatriptan as needed - please do not combine with Zomig       Follow-up in 6 months or call earlier if needed      Thank you for coming to see [place] at [facility]. I hope we have been able to provide you high quality care today.  You may receive a patient satisfaction survey over the next few weeks. We would appreciate your feedback and comments so that we may continue to improve ourselves and the health of our patients.
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
## 2023-03-21 DIAGNOSIS — Z0289 Encounter for other administrative examinations: Secondary | ICD-10-CM

## 2023-03-22 ENCOUNTER — Telehealth: Payer: Self-pay | Admitting: Neurology

## 2023-03-22 NOTE — Telephone Encounter (Signed)
FMLA was completed and placed in the box for Shanda Bumps NP to sign

## 2023-04-05 DIAGNOSIS — Z79899 Other long term (current) drug therapy: Secondary | ICD-10-CM | POA: Diagnosis not present

## 2023-04-05 DIAGNOSIS — E559 Vitamin D deficiency, unspecified: Secondary | ICD-10-CM | POA: Diagnosis not present

## 2023-04-06 ENCOUNTER — Encounter: Payer: Self-pay | Admitting: Adult Health

## 2023-05-02 DIAGNOSIS — Z79899 Other long term (current) drug therapy: Secondary | ICD-10-CM | POA: Diagnosis not present

## 2023-05-08 DIAGNOSIS — Z79899 Other long term (current) drug therapy: Secondary | ICD-10-CM | POA: Diagnosis not present

## 2023-05-15 ENCOUNTER — Ambulatory Visit: Payer: Medicaid Other | Admitting: Obstetrics and Gynecology

## 2023-05-31 DIAGNOSIS — Z79899 Other long term (current) drug therapy: Secondary | ICD-10-CM | POA: Diagnosis not present

## 2023-06-26 ENCOUNTER — Ambulatory Visit: Payer: Medicaid Other | Admitting: Obstetrics and Gynecology

## 2023-06-26 ENCOUNTER — Encounter: Payer: Self-pay | Admitting: Obstetrics and Gynecology

## 2023-06-26 ENCOUNTER — Other Ambulatory Visit (HOSPITAL_COMMUNITY)
Admission: RE | Admit: 2023-06-26 | Discharge: 2023-06-26 | Disposition: A | Discharge status: Home / Self Care | Payer: Medicaid Other | Source: Ambulatory Visit | Attending: Obstetrics and Gynecology | Admitting: Obstetrics and Gynecology

## 2023-06-26 VITALS — BP 108/70 | HR 86 | Ht 68.0 in | Wt 154.0 lb

## 2023-06-26 DIAGNOSIS — Z01419 Encounter for gynecological examination (general) (routine) without abnormal findings: Secondary | ICD-10-CM | POA: Insufficient documentation

## 2023-06-26 DIAGNOSIS — N83209 Unspecified ovarian cyst, unspecified side: Secondary | ICD-10-CM | POA: Diagnosis not present

## 2023-06-26 DIAGNOSIS — Z1339 Encounter for screening examination for other mental health and behavioral disorders: Secondary | ICD-10-CM

## 2023-06-26 MED ORDER — IBUPROFEN 800 MG PO TABS: 800.0000 mg | ORAL_TABLET | Freq: Three times a day (TID) | ORAL | 1 refills | Status: DC | PRN

## 2023-06-26 NOTE — Patient Instructions (Signed)

## 2023-06-26 NOTE — Progress Notes (Signed)
 38 y.o. New GYN presents for AEX/PAP.  Pt declinces STD screening.

## 2023-06-26 NOTE — Progress Notes (Signed)
 Subjective:     Brooke Christian is a 38 y.o. female P4 with LMP 06/05/23 and BMI 23 who is here for a comprehensive physical exam. The patient reports dysmenorrhea. She reports a monthly period lasting 6 days. She reports heavy flow and cramping for the first 4 days. She is sexually active using BTL for contraception. She denies pelvic pain or abnormal discharge. She is without any other complaints. Patient states she had an ultrasound 4 months ago which demonstrated an ovarian cyst  Past Medical History:  Diagnosis Date   Anemia    Anxiety    Asthma    Hypothyroidism    Past Surgical History:  Procedure Laterality Date   TUBAL LIGATION     Family History  Problem Relation Age of Onset   Healthy Mother    Healthy Father     Social History   Socioeconomic History   Marital status: Single    Spouse name: Not on file   Number of children: 4   Years of education: Not on file   Highest education level: Not on file  Occupational History   Not on file  Tobacco Use   Smoking status: Every Day    Types: Cigarettes   Smokeless tobacco: Never  Vaping Use   Vaping status: Never Used  Substance and Sexual Activity   Alcohol use: No   Drug use: Yes    Types: Marijuana   Sexual activity: Yes    Birth control/protection: Surgical  Other Topics Concern   Not on file  Social History Narrative   Not on file   Social Drivers of Health   Financial Resource Strain: Not on file  Food Insecurity: Not on file  Transportation Needs: Not on file  Physical Activity: Not on file  Stress: Not on file  Social Connections: Not on file  Intimate Partner Violence: Not on file   Health Maintenance  Topic Date Due   COVID-19 Vaccine (1) Never done   Pneumococcal Vaccine 17-32 Years old (1 of 2 - PCV) Never done   DTaP/Tdap/Td (1 - Tdap) Never done   Cervical Cancer Screening (HPV/Pap Cotest)  Never done   INFLUENZA VACCINE  Completed   Hepatitis C Screening  Completed   HIV Screening   Completed   HPV VACCINES  Aged Out       Review of Systems Pertinent items noted in HPI and remainder of comprehensive ROS otherwise negative.   Objective:  Blood pressure 108/70, pulse 86, height 5\' 8"  (1.727 m), weight 154 lb (69.9 kg), last menstrual period 06/05/2023.   GENERAL: Well-developed, well-nourished female in no acute distress.  HEENT: Normocephalic, atraumatic. Sclerae anicteric.  NECK: Supple. Normal thyroid.  LUNGS: Clear to auscultation bilaterally.  HEART: Regular rate and rhythm. BREASTS: Symmetric in size. No palpable masses or lymphadenopathy, skin changes, or nipple drainage. ABDOMEN: Soft, nontender, nondistended. No organomegaly. PELVIC: Normal external female genitalia. Vagina is pink and rugated.  Normal discharge. Normal appearing cervix. Uterus is normal in size. No adnexal mass or tenderness. Chaperone present during the pelvic exam EXTREMITIES: No cyanosis, clubbing, or edema, 2+ distal pulses.     Assessment:    Healthy female exam.      Plan:    Pap smear collected Patient declined STI screening Pelvic ultrasound ordered to follow up on ovarian cyst Patient will be contacted with abnormal results Discussed medical management of dysmenorrhea with scheduled dosing of NSAID. Patient states she has a prescription for celebrex. Also discussed hormonal contraception  for cycle control. Patient is hesitant regarding hormonal contraception as she never used it in the past Information provided for patient to review and consider if dysmenorrhea is not better managed See After Visit Summary for Counseling Recommendations

## 2023-06-27 ENCOUNTER — Encounter: Payer: Self-pay | Admitting: Obstetrics and Gynecology

## 2023-06-27 ENCOUNTER — Other Ambulatory Visit: Payer: Self-pay

## 2023-06-27 LAB — CYTOLOGY - PAP
Comment: NEGATIVE
Diagnosis: NEGATIVE
High risk HPV: NEGATIVE

## 2023-06-27 MED ORDER — METRONIDAZOLE 500 MG PO TABS: 500.0000 mg | ORAL_TABLET | Freq: Two times a day (BID) | ORAL | 0 refills | Status: DC

## 2023-07-04 ENCOUNTER — Ambulatory Visit (HOSPITAL_BASED_OUTPATIENT_CLINIC_OR_DEPARTMENT_OTHER)
Admission: RE | Admit: 2023-07-04 | Discharge: 2023-07-04 | Disposition: A | Discharge status: Home / Self Care | Payer: Medicaid Other | Source: Ambulatory Visit | Attending: Obstetrics and Gynecology | Admitting: Obstetrics and Gynecology

## 2023-07-04 DIAGNOSIS — Z79899 Other long term (current) drug therapy: Secondary | ICD-10-CM | POA: Diagnosis not present

## 2023-07-04 DIAGNOSIS — N83209 Unspecified ovarian cyst, unspecified side: Secondary | ICD-10-CM | POA: Insufficient documentation

## 2023-07-04 DIAGNOSIS — D259 Leiomyoma of uterus, unspecified: Secondary | ICD-10-CM | POA: Diagnosis not present

## 2023-07-06 DIAGNOSIS — Z79899 Other long term (current) drug therapy: Secondary | ICD-10-CM | POA: Diagnosis not present

## 2023-07-06 DIAGNOSIS — E559 Vitamin D deficiency, unspecified: Secondary | ICD-10-CM | POA: Diagnosis not present

## 2023-07-08 DIAGNOSIS — Z79899 Other long term (current) drug therapy: Secondary | ICD-10-CM | POA: Diagnosis not present

## 2023-07-24 ENCOUNTER — Encounter: Payer: Self-pay | Admitting: Obstetrics and Gynecology

## 2023-08-02 DIAGNOSIS — Z79899 Other long term (current) drug therapy: Secondary | ICD-10-CM | POA: Diagnosis not present

## 2023-08-08 DIAGNOSIS — Z79899 Other long term (current) drug therapy: Secondary | ICD-10-CM | POA: Diagnosis not present

## 2023-08-29 DIAGNOSIS — Z79899 Other long term (current) drug therapy: Secondary | ICD-10-CM | POA: Diagnosis not present

## 2023-09-05 DIAGNOSIS — Z79899 Other long term (current) drug therapy: Secondary | ICD-10-CM | POA: Diagnosis not present

## 2023-09-13 ENCOUNTER — Ambulatory Visit: Payer: Medicaid Other | Admitting: Adult Health

## 2023-09-13 ENCOUNTER — Encounter: Payer: Self-pay | Admitting: Adult Health

## 2023-09-13 VITALS — BP 116/62 | HR 64 | Ht 68.0 in | Wt 157.0 lb

## 2023-09-13 DIAGNOSIS — G43829 Menstrual migraine, not intractable, without status migrainosus: Secondary | ICD-10-CM | POA: Diagnosis not present

## 2023-09-13 MED ORDER — ONDANSETRON 4 MG PO TBDP: 4.0000 mg | ORAL_TABLET | Freq: Three times a day (TID) | ORAL | 5 refills | Status: AC | PRN

## 2023-09-13 MED ORDER — ZOLMITRIPTAN 2.5 MG PO TABS: ORAL_TABLET | ORAL | 11 refills | Status: AC

## 2023-09-13 MED ORDER — SUMATRIPTAN SUCCINATE 100 MG PO TABS: 100.0000 mg | ORAL_TABLET | Freq: Once | ORAL | 11 refills | Status: AC | PRN

## 2023-09-13 NOTE — Progress Notes (Signed)
 Primary neurologist: Dr. Billy Bue PCP: Marius Siemens, NP    CC:  headaches Chief Complaint  Patient presents with   Follow-up    Pt alone, rm 8. Here for follow up overall stable and doing well. She had a migraine last week but able to still use medication and it helps     History provided from self  Follow-up visit:  Prior visit: 03/08/2023   Brief HPI:   Brooke Christian is a 38 y.o. 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  At prior visit, recommended use of zolmitriptan  during menstrual cycle  Interval history:  Returns today for follow-up visit.  Reports great benefit with zolmitriptan  during menstrual cycle, she has not experienced migraine recently during cycle. She did have a migraine last Saturday (not associated with cycle) which feels was stress induced, used zolmitriptan  with resolution of migraine.  She also continues to use Zofran  as needed.  She will occasionally use sumatriptan  (if she has this one hand and not the zolmitriptan ), does not use both triptans together.  She is currently satisfied with her current regimen.    Headache History: Onset: April 2023 Triggers: menstrual cycle Aura: no Location: retro-orbital, occiput Associated Symptoms:  Photophobia: yes  Phonophobia: yes  Nausea: yes Vomiting: yes Worse with activity?: yes Duration of headaches: several hours  Headache days per month: 1 Headache free days per month: 29  Current Treatment: Abortive Zolmitriptan  Sumatriptan   Preventative Zolmitriptan  during cycle   Prior Therapies                                 Baclofen  Zofran  Excedrin Toradol  Rizatriptan  - not effective Sumatriptan  100mg   Zolmitriptan      LABS: CBC    Component Value Date/Time   WBC 9.4 04/20/2022 1109   RBC 3.94 04/20/2022 1109   HGB 12.5 04/20/2022 1109   HGB 12.5 01/10/2022 1002   HCT 37.3 04/20/2022 1109   HCT 37.4 01/10/2022 1002    PLT 428 (H) 04/20/2022 1109   PLT 315 01/10/2022 1002   MCV 94.7 04/20/2022 1109   MCV 94 01/10/2022 1002   MCH 31.7 04/20/2022 1109   MCHC 33.5 04/20/2022 1109   RDW 12.6 04/20/2022 1109   RDW 11.8 01/10/2022 1002   LYMPHSABS 2.2 01/10/2022 1002   EOSABS 0.7 (H) 01/10/2022 1002   BASOSABS 0.1 01/10/2022 1002      Latest Ref Rng & Units 04/20/2022   11:09 AM 01/10/2022   10:02 AM 08/15/2007    4:28 PM  CMP  Glucose 70 - 99 mg/dL 604  80  83   BUN 6 - 20 mg/dL 12  7  <3   Creatinine 0.44 - 1.00 mg/dL 5.40  9.81  0.5   Sodium 135 - 145 mmol/L 138  139  137   Potassium 3.5 - 5.1 mmol/L 3.5  4.5  3.6   Chloride 98 - 111 mmol/L 108  104  105   CO2 22 - 32 mmol/L 20  20    Calcium 8.9 - 10.3 mg/dL 9.6  9.5    Total Protein 6.5 - 8.1 g/dL 8.2  7.3    Total Bilirubin 0.3 - 1.2 mg/dL 0.3  0.4    Alkaline Phos 38 - 126 U/L 48  59    AST 15 - 41 U/L 21  17    ALT 0 - 44 U/L 15  14       IMAGING:  none   Current Outpatient Medications on File Prior to Visit  Medication Sig Dispense Refill   albuterol  (VENTOLIN  HFA) 108 (90 Base) MCG/ACT inhaler Inhale 1-2 puffs into the lungs every 4 (four) hours as needed for wheezing or shortness of breath. 1 each 0   celecoxib (CELEBREX) 200 MG capsule Take 200 mg by mouth 2 (two) times daily.     hydrOXYzine (VISTARIL) 25 MG capsule Take 25 mg by mouth 3 (three) times daily.     ibuprofen  (ADVIL ) 800 MG tablet Take 1 tablet (800 mg total) by mouth every 8 (eight) hours as needed. 60 tablet 1   levothyroxine  (SYNTHROID ) 50 MCG tablet Take 1 tablet (50 mcg total) by mouth daily. 90 tablet 0   metroNIDAZOLE  (FLAGYL ) 500 MG tablet Take 1 tablet (500 mg total) by mouth 2 (two) times daily. 14 tablet 0   montelukast  (SINGULAIR ) 10 MG tablet Take 1 tablet (10 mg total) by mouth at bedtime. 30 tablet 2   Oxycodone HCl 10 MG TABS Take 10 mg by mouth 4 (four) times daily as needed.     No current facility-administered medications on file prior to visit.      Allergies: Allergies  Allergen Reactions   Naproxen  Nausea And Vomiting    Family History: Migraine or other headaches in the family:  no Aneurysms in a first degree relative:  no Brain tumors in the family:  no Other neurological illness in the family:   no  Past Medical History: Past Medical History:  Diagnosis Date   Anemia    Anxiety    Asthma    Hypothyroidism     Past Surgical History Past Surgical History:  Procedure Laterality Date   TUBAL LIGATION      Social History: Social History   Tobacco Use   Smoking status: Every Day    Types: Cigarettes   Smokeless tobacco: Never  Vaping Use   Vaping status: Never Used  Substance Use Topics   Alcohol use: No   Drug use: Yes    Types: Marijuana     ROS: Negative for fevers, chills. Positive for headaches. All other systems reviewed and negative unless stated otherwise in HPI.   Physical Exam:   Vital Signs: BP 116/62   Pulse 64   Ht 5\' 8"  (1.727 m)   Wt 157 lb (71.2 kg)   BMI 23.87 kg/m  GENERAL: well appearing,in no acute distress,alert SKIN:  Color, texture, turgor normal. No rashes or lesions HEAD:  Normocephalic/atraumatic. CV:  RRR RESP: Normal respiratory effort MSK: no tenderness to palpation over occiput, neck, or shoulders  NEUROLOGICAL: Mental Status: Alert, oriented to person, place and time,Follows commands Cranial Nerves: PERRL, visual fields intact to confrontation, extraocular movements intact, decreased sensation over left V2, no facial droop or ptosis, hearing grossly intact, no dysarthria Motor: muscle strength 5/5 both upper and lower extremities,no drift, normal tone Reflexes: 2+ throughout Sensation: decreased sensation to light touch over LLE Coordination: Finger-to- nose-finger intact bilaterally,Heel-to-shin intact bilaterally Gait: normal-based     IMPRESSION: 38 year old female with a history of asthma who presents for follow-up of worsening headaches.   Initially seen by Dr. Billy Bue on 11/18/2021.  MRI brain unremarkable.  Headaches consistent with menstrual migraine.  Reports great benefit with zolmitriptan  during menstrual cycle, recently had migraine outside of cycle (stress related) which resolved after zolmitriptan .     PLAN: -continue zolmitriptan  2.5mg  BID 2 days prior to menses  and continue for 5 days (for total of 7 days), can use extra dose if needed for rescue therapy. Max dose 10mg /24 hrs.  -refill provided for sumatriptan  which she will use if she does not have zolmitriptan  on hand -continue zofran  as needed -if zolmitriptan  seems to become ineffective, consider frovatriptan or Qulipta     Follow-up in 1 year or call earlier if needed     I spent 20 minutes of face-to-face and non-face-to-face time with patient.  This included previsit chart review, lab review, study review, order entry, electronic health record documentation, patient education and discussion regarding above diagnoses and treatment plan and answered all the questions to patient's satisfaction  Johny Nap, [facility]  [facility] [address] [place], [place] [address]  Phone [phone] Fax [phone] Note: This document was prepared with digital dictation and possible smart phrase technology. Any transcriptional errors that result from this process are unintentional.

## 2023-09-13 NOTE — Patient Instructions (Addendum)
 Your Plan:  Continue zolmitriptan  or sumatriptan  as needed for migraine rescue and zolmitriptan  during menstrual cycle   Continue Zofran  as needed   Please call with any worsening migraines      Follow up in 1 year or call earlier if needed       Thank you for coming to see us  at [facility]. I hope we have been able to provide you high quality care today.  You may receive a patient satisfaction survey over the next few weeks. We would appreciate your feedback and comments so that we may continue to improve ourselves and the health of our patients.

## 2023-09-26 ENCOUNTER — Encounter: Payer: Self-pay | Admitting: Adult Health

## 2023-09-27 ENCOUNTER — Telehealth: Payer: Self-pay | Admitting: Neurology

## 2023-09-27 NOTE — Telephone Encounter (Signed)
 PA completed on CMM/optum [id number]/ [id number] Will await determination. Can take 24-72 hrs.

## 2023-10-01 DIAGNOSIS — Z79899 Other long term (current) drug therapy: Secondary | ICD-10-CM | POA: Diagnosis not present

## 2023-10-01 DIAGNOSIS — M129 Arthropathy, unspecified: Secondary | ICD-10-CM | POA: Diagnosis not present

## 2023-10-01 DIAGNOSIS — E559 Vitamin D deficiency, unspecified: Secondary | ICD-10-CM | POA: Diagnosis not present

## 2023-10-01 DIAGNOSIS — Z131 Encounter for screening for diabetes mellitus: Secondary | ICD-10-CM | POA: Diagnosis not present

## 2023-10-04 DIAGNOSIS — Z79899 Other long term (current) drug therapy: Secondary | ICD-10-CM | POA: Diagnosis not present

## 2023-10-13 ENCOUNTER — Emergency Department (HOSPITAL_COMMUNITY)
Admission: EM | Admit: 2023-10-13 | Discharge: 2023-10-13 | Disposition: A | Discharge status: Home / Self Care | Attending: Emergency Medicine | Admitting: Emergency Medicine

## 2023-10-13 ENCOUNTER — Encounter (HOSPITAL_COMMUNITY): Payer: Self-pay | Admitting: *Deleted

## 2023-10-13 ENCOUNTER — Other Ambulatory Visit: Payer: Self-pay

## 2023-10-13 DIAGNOSIS — I499 Cardiac arrhythmia, unspecified: Secondary | ICD-10-CM | POA: Diagnosis not present

## 2023-10-13 DIAGNOSIS — R0689 Other abnormalities of breathing: Secondary | ICD-10-CM | POA: Diagnosis not present

## 2023-10-13 DIAGNOSIS — R112 Nausea with vomiting, unspecified: Secondary | ICD-10-CM | POA: Insufficient documentation

## 2023-10-13 DIAGNOSIS — R1030 Lower abdominal pain, unspecified: Secondary | ICD-10-CM | POA: Diagnosis not present

## 2023-10-13 DIAGNOSIS — R0902 Hypoxemia: Secondary | ICD-10-CM | POA: Diagnosis not present

## 2023-10-13 DIAGNOSIS — R9431 Abnormal electrocardiogram [ECG] [EKG]: Secondary | ICD-10-CM | POA: Diagnosis not present

## 2023-10-13 DIAGNOSIS — R1084 Generalized abdominal pain: Secondary | ICD-10-CM | POA: Diagnosis not present

## 2023-10-13 LAB — COMPREHENSIVE METABOLIC PANEL WITH GFR
ALT: 13 U/L (ref 0–44)
AST: 24 U/L (ref 15–41)
Albumin: 4.1 g/dL (ref 3.5–5.0)
Alkaline Phosphatase: 53 U/L (ref 38–126)
Anion gap: 10 (ref 5–15)
BUN: 9 mg/dL (ref 6–20)
CO2: 16 mmol/L — ABNORMAL LOW (ref 22–32)
Calcium: 8.8 mg/dL — ABNORMAL LOW (ref 8.9–10.3)
Chloride: 108 mmol/L (ref 98–111)
Creatinine, Ser: 0.67 mg/dL (ref 0.44–1.00)
GFR, Estimated: >60 mL/min (ref >60)
Glucose, Bld: 110 mg/dL — ABNORMAL HIGH (ref 70–99)
Potassium: 3.3 mmol/L — ABNORMAL LOW (ref 3.5–5.1)
Sodium: 134 mmol/L — ABNORMAL LOW (ref 135–145)
Total Bilirubin: 0.9 mg/dL (ref 0.0–1.2)
Total Protein: 7.5 g/dL (ref 6.5–8.1)

## 2023-10-13 LAB — URINALYSIS, ROUTINE W REFLEX MICROSCOPIC
Bacteria, UA: NONE SEEN
Bilirubin Urine: NEGATIVE
Glucose, UA: NEGATIVE mg/dL
Ketones, ur: 20 mg/dL — AB
Leukocytes,Ua: NEGATIVE
Nitrite: NEGATIVE
Protein, ur: NEGATIVE mg/dL
Specific Gravity, Urine: 1.012 (ref 1.005–1.030)
pH: 9 — ABNORMAL HIGH (ref 5.0–8.0)

## 2023-10-13 LAB — CBC WITH DIFFERENTIAL/PLATELET
Abs Immature Granulocytes: 0.03 10*3/uL (ref 0.00–0.07)
Basophils Absolute: 0.1 10*3/uL (ref 0.0–0.1)
Basophils Relative: 1 %
Eosinophils Absolute: 0.2 10*3/uL (ref 0.0–0.5)
Eosinophils Relative: 2 %
HCT: 37.4 % (ref 36.0–46.0)
Hemoglobin: 11.9 g/dL — ABNORMAL LOW (ref 12.0–15.0)
Immature Granulocytes: 0 %
Lymphocytes Relative: 21 %
Lymphs Abs: 1.5 10*3/uL (ref 0.7–4.0)
MCH: 31.3 pg (ref 26.0–34.0)
MCHC: 31.8 g/dL (ref 30.0–36.0)
MCV: 98.4 fL (ref 80.0–100.0)
Monocytes Absolute: 0.5 10*3/uL (ref 0.1–1.0)
Monocytes Relative: 7 %
Neutro Abs: 4.8 10*3/uL (ref 1.7–7.7)
Neutrophils Relative %: 69 %
Platelets: 344 10*3/uL (ref 150–400)
RBC: 3.8 MIL/uL — ABNORMAL LOW (ref 3.87–5.11)
RDW: 13.2 % (ref 11.5–15.5)
WBC: 7 10*3/uL (ref 4.0–10.5)
nRBC: 0 % (ref 0.0–0.2)

## 2023-10-13 LAB — LIPASE, BLOOD: Lipase: 26 U/L (ref 11–51)

## 2023-10-13 LAB — PREGNANCY, URINE: Preg Test, Ur: NEGATIVE

## 2023-10-13 MED ORDER — DROPERIDOL 2.5 MG/ML IJ SOLN
0.6250 mg | Freq: Once | INTRAMUSCULAR | Status: AC
  Administered 2023-10-13: 0.625 mg via INTRAVENOUS
  Filled 2023-10-13: qty 2

## 2023-10-13 MED ORDER — SODIUM CHLORIDE 0.9 % IV BOLUS
1000.0000 mL | Freq: Once | INTRAVENOUS | Status: AC
  Administered 2023-10-13: 1000 mL via INTRAVENOUS

## 2023-10-13 MED ORDER — ONDANSETRON 4 MG PO TBDP: 4.0000 mg | ORAL_TABLET | Freq: Three times a day (TID) | ORAL | 0 refills | Status: AC | PRN

## 2023-10-13 MED ORDER — LORAZEPAM 2 MG/ML IJ SOLN
1.0000 mg | Freq: Once | INTRAMUSCULAR | Status: AC
  Administered 2023-10-13: 1 mg via INTRAVENOUS
  Filled 2023-10-13: qty 1

## 2023-10-13 MED ORDER — ONDANSETRON HCL 4 MG/2ML IJ SOLN
4.0000 mg | Freq: Once | INTRAMUSCULAR | Status: AC
  Administered 2023-10-13: 4 mg via INTRAVENOUS
  Filled 2023-10-13: qty 2

## 2023-10-13 NOTE — ED Notes (Signed)
 Pt resltess, irritable. Removed self from monitor. States, feel a little better. Admits to marijuana last night.

## 2023-10-13 NOTE — ED Notes (Signed)
 Remains anxious, restless, pulled self off monitor, standing at Brooke Christian. Steady gait to b/r, attempting urine sample.

## 2023-10-13 NOTE — ED Notes (Signed)
 Panic attack in progress, hyperventilating.

## 2023-10-13 NOTE — ED Provider Notes (Signed)
 [place] EMERG[facility]Y DEPARTMENT AT [facility] Provider Note   CSN: [id number] Arrival date & time: 10/13/23  5621     Patient presents with: Emesis   Brooke Christian is a 38 y.o. female.   38 year old female with prior medical history as detailed below presents for evaluation.  Patient complains of nausea, vomiting, abdominal pain.  Patient reports that her left lower molars were hurting.  This began yesterday.  She took a [employer] powder late last night.  Shortly thereafter she had to have nausea and vomiting.  She thinks that the Goody's powder caused her to be as sick as fuck.  Patient denies fever.  She denies diarrhea.  She denies chest pain or shortness of breath.  She did not take anything for her nausea.  She reports that she is throwing up blood.  She reports that her last period was on June 5.  She denies pregnancy  The history is provided by the patient.       Prior to Admission medications   Medication Sig Start Date End Date Taking? Authorizing Provider  albuterol  (VENTOLIN  HFA) 108 (90 Base) MCG/ACT inhaler Inhale 1-2 puffs into the lungs every 4 (four) hours as needed for wheezing or shortness of breath. 07/26/21   Banister, Paige Boatman, MD  celecoxib (CELEBREX) 200 MG capsule Take 200 mg by mouth 2 (two) times daily. 03/07/23   [provider]  hydrOXYzine (VISTARIL) 25 MG capsule Take 25 mg by mouth 3 (three) times daily. 02/07/23   [provider]  ibuprofen  (ADVIL ) 800 MG tablet Take 1 tablet (800 mg total) by mouth every 8 (eight) hours as needed. 06/26/23   Constant, Peggy, MD  levothyroxine  (SYNTHROID ) 50 MCG tablet Take 1 tablet (50 mcg total) by mouth daily. 01/13/22   Marius Siemens, NP  metroNIDAZOLE  (FLAGYL ) 500 MG tablet Take 1 tablet (500 mg total) by mouth 2 (two) times daily. 06/27/23   Dove, Myra C, MD  montelukast  (SINGULAIR ) 10 MG tablet Take 1 tablet (10 mg total) by mouth at bedtime. 07/26/21   Ann Keto,  MD  ondansetron  (ZOFRAN -ODT) 4 MG disintegrating tablet Take 1 tablet (4 mg total) by mouth every 8 (eight) hours as needed for nausea or vomiting. 09/13/23   Johny Nap, NP  Oxycodone HCl 10 MG TABS Take 10 mg by mouth 4 (four) times daily as needed. 03/07/23   [provider]  SUMAtriptan  (IMITREX ) 100 MG tablet Take 1 tablet (100 mg total) by mouth once as needed for up to 1 dose for migraine. May repeat in 2 hours if headache persists or recurs. 09/13/23   Johny Nap, NP  ZOLMitriptan  (ZOMIG ) 2.5 MG tablet Take 1 tab BID 2 days prior to start of menses, continue for 5 days after (total of 7 days), can use extra dose if needed for rescue therapy 09/13/23   Johny Nap, NP    Allergies: Naproxen     Review of Systems  All other systems reviewed and are negative.   Updated Vital Signs SpO2 98%   Physical Exam Vitals and nursing note reviewed.  Constitutional:      General: She is not in acute distress.    Appearance: She is well-developed.     Comments: Alert but uncomfortable, rolling around on the stretcher complaining of severe nausea  HENT:     Head: Normocephalic and atraumatic.   Eyes:     Conjunctiva/sclera: Conjunctivae normal.     Pupils: Pupils are equal, round, and reactive  to light.    Cardiovascular:     Rate and Rhythm: Normal rate and regular rhythm.     Heart sounds: Normal heart sounds.  Pulmonary:     Effort: Pulmonary effort is normal. No respiratory distress.     Breath sounds: Normal breath sounds.  Abdominal:     General: There is no distension.     Palpations: Abdomen is soft.     Tenderness: There is no abdominal tenderness.   Musculoskeletal:        General: No deformity. Normal range of motion.     Cervical back: Normal range of motion and neck supple.   Skin:    General: Skin is warm and dry.   Neurological:     General: No focal deficit present.     Mental Status: She is alert and oriented to person, place, and time. Mental  status is at baseline.     (all labs ordered are listed, but only abnormal results are displayed) Labs Reviewed  COMPREHENSIVE METABOLIC PANEL WITH GFR  CBC WITH DIFFERENTIAL/PLATELET  LIPASE, BLOOD  HCG, SERUM, QUALITATIVE  PREGNANCY, URINE  URINALYSIS, ROUTINE W REFLEX MICROSCOPIC    EKG: None  Radiology: No results found.   Procedures   Medications Ordered in the ED  sodium chloride 0.9 % bolus 1,000 mL (has no administration in time range)  ondansetron  (ZOFRAN ) injection 4 mg (has no administration in time range)  LORazepam (ATIVAN) injection 1 mg (has no administration in time range)                                    Medical Decision Making Amount and/or Complexity of Data Reviewed Labs: ordered.  Risk Prescription drug management.    Medical Screen Complete  This patient presented to the ED with complaint of nausea, vomiting, diarrhea.  This complaint involves an extensive number of treatment options. The initial differential diagnosis includes, but is not limited to, metabolic abnormality, etc.  This presentation is: Acute, Self-Limited, Previously Undiagnosed, Uncertain Prognosis, Complicated, Systemic Symptoms, and Threat to Life/Bodily Function  Patient presents with nausea, vomiting, anxiety.  Screening labs are without significant abnormality.  After IV fluids and medication patient feels improved.  She desires discharge home.    Importance of close follow-up stressed.  Strict turn precautions given and understood.  Additional history obtained: External records from outside sources obtained and reviewed including prior ED visits and prior Inpatient records.    Problem List / ED Course:  Nausea, vomiting   Reevaluation:  After the interventions noted above, I reevaluated the patient and found that they have: improved  Disposition:  After consideration of the diagnostic results and the patients response to treatment, I feel that the  patent would benefit from close outpatient follow-up.       Final diagnoses:  Nausea and vomiting, unspecified vomiting type    ED Discharge Orders          Ordered    ondansetron  (ZOFRAN -ODT) 4 MG disintegrating tablet  Every 8 hours PRN        10/13/23 1223               Burnette Carte, MD 10/13/23 1306

## 2023-10-13 NOTE — ED Triage Notes (Addendum)
 BIB [facility] from home, pt anxious and hyperventilating, here for NVD, abd and CP. Onset last night with toothache, then took goodies powder and marijuana and went to bed. Woke at 3a with abd pain, NV, then CP. Describes possible bloody emesis/ coffee ground emesis. Denies ETOH. Denies fever, sick contacts, recent ABT, or travel. Vx3 total. D x1. EDP in to see. Pt intolerant of questions d/t sx.

## 2023-10-13 NOTE — Discharge Instructions (Addendum)
 Return for any problem.  ?

## 2023-10-15 ENCOUNTER — Emergency Department (HOSPITAL_BASED_OUTPATIENT_CLINIC_OR_DEPARTMENT_OTHER)

## 2023-10-15 ENCOUNTER — Emergency Department (HOSPITAL_BASED_OUTPATIENT_CLINIC_OR_DEPARTMENT_OTHER)
Admission: EM | Admit: 2023-10-15 | Discharge: 2023-10-15 | Disposition: A | Discharge status: Home / Self Care | Attending: Emergency Medicine | Admitting: Emergency Medicine

## 2023-10-15 DIAGNOSIS — F1210 Cannabis abuse, uncomplicated: Secondary | ICD-10-CM | POA: Diagnosis not present

## 2023-10-15 DIAGNOSIS — Z79899 Other long term (current) drug therapy: Secondary | ICD-10-CM | POA: Insufficient documentation

## 2023-10-15 DIAGNOSIS — K760 Fatty (change of) liver, not elsewhere classified: Secondary | ICD-10-CM | POA: Diagnosis not present

## 2023-10-15 DIAGNOSIS — R110 Nausea: Secondary | ICD-10-CM | POA: Insufficient documentation

## 2023-10-15 DIAGNOSIS — F1110 Opioid abuse, uncomplicated: Secondary | ICD-10-CM | POA: Diagnosis not present

## 2023-10-15 DIAGNOSIS — E039 Hypothyroidism, unspecified: Secondary | ICD-10-CM | POA: Insufficient documentation

## 2023-10-15 DIAGNOSIS — Z7951 Long term (current) use of inhaled steroids: Secondary | ICD-10-CM | POA: Diagnosis not present

## 2023-10-15 DIAGNOSIS — R1084 Generalized abdominal pain: Secondary | ICD-10-CM | POA: Diagnosis not present

## 2023-10-15 DIAGNOSIS — R1013 Epigastric pain: Secondary | ICD-10-CM | POA: Insufficient documentation

## 2023-10-15 DIAGNOSIS — J45909 Unspecified asthma, uncomplicated: Secondary | ICD-10-CM | POA: Insufficient documentation

## 2023-10-15 DIAGNOSIS — R109 Unspecified abdominal pain: Secondary | ICD-10-CM | POA: Diagnosis not present

## 2023-10-15 DIAGNOSIS — R112 Nausea with vomiting, unspecified: Secondary | ICD-10-CM | POA: Diagnosis not present

## 2023-10-15 DIAGNOSIS — R1111 Vomiting without nausea: Secondary | ICD-10-CM | POA: Diagnosis not present

## 2023-10-15 LAB — URINALYSIS, ROUTINE W REFLEX MICROSCOPIC
Bacteria, UA: NONE SEEN
Bilirubin Urine: NEGATIVE
Glucose, UA: NEGATIVE mg/dL
Ketones, ur: 40 mg/dL — AB
Leukocytes,Ua: NEGATIVE
Nitrite: NEGATIVE
Protein, ur: 30 mg/dL — AB
Specific Gravity, Urine: >1.046 — ABNORMAL HIGH (ref 1.005–1.030)
pH: 6.5 (ref 5.0–8.0)

## 2023-10-15 LAB — COMPREHENSIVE METABOLIC PANEL WITH GFR
ALT: 19 U/L (ref 0–44)
AST: 48 U/L — ABNORMAL HIGH (ref 15–41)
Albumin: 3.9 g/dL (ref 3.5–5.0)
Alkaline Phosphatase: 53 U/L (ref 38–126)
Anion gap: 15 (ref 5–15)
BUN: 13 mg/dL (ref 6–20)
CO2: 19 mmol/L — ABNORMAL LOW (ref 22–32)
Calcium: 8.7 mg/dL — ABNORMAL LOW (ref 8.9–10.3)
Chloride: 101 mmol/L (ref 98–111)
Creatinine, Ser: 0.69 mg/dL (ref 0.44–1.00)
GFR, Estimated: >60 mL/min (ref >60)
Glucose, Bld: 81 mg/dL (ref 70–99)
Potassium: 3.5 mmol/L (ref 3.5–5.1)
Sodium: 134 mmol/L — ABNORMAL LOW (ref 135–145)
Total Bilirubin: 0.6 mg/dL (ref 0.0–1.2)
Total Protein: 7.1 g/dL (ref 6.5–8.1)

## 2023-10-15 LAB — CBC
HCT: 36.8 % (ref 36.0–46.0)
Hemoglobin: 12.7 g/dL (ref 12.0–15.0)
MCH: 31.4 pg (ref 26.0–34.0)
MCHC: 34.5 g/dL (ref 30.0–36.0)
MCV: 91.1 fL (ref 80.0–100.0)
Platelets: 191 10*3/uL (ref 150–400)
RBC: 4.04 MIL/uL (ref 3.87–5.11)
RDW: 13.2 % (ref 11.5–15.5)
WBC: 8.2 10*3/uL (ref 4.0–10.5)
nRBC: 0 % (ref 0.0–0.2)

## 2023-10-15 LAB — URINE DRUG SCREEN
Amphetamines: NOT DETECTED
Barbiturates: NOT DETECTED
Benzodiazepines: NOT DETECTED
Cocaine: NOT DETECTED
Fentanyl: DETECTED — AB
Methadone Scn, Ur: NOT DETECTED
Opiates: NOT DETECTED
Tetrahydrocannabinol: DETECTED — AB

## 2023-10-15 LAB — LIPASE, BLOOD: Lipase: 24 U/L (ref 11–51)

## 2023-10-15 LAB — HCG, SERUM, QUALITATIVE: Preg, Serum: NEGATIVE

## 2023-10-15 MED ORDER — ONDANSETRON HCL 4 MG/2ML IJ SOLN
4.0000 mg | Freq: Once | INTRAMUSCULAR | Status: AC
Start: 2023-10-15 — End: 2023-10-15
  Administered 2023-10-15: 4 mg via INTRAVENOUS
  Filled 2023-10-15: qty 2

## 2023-10-15 MED ORDER — ALUM & MAG HYDROXIDE-SIMETH 200-200-20 MG/5ML PO SUSP
30.0000 mL | Freq: Once | ORAL | Status: DC
  Filled 2023-10-15: qty 30

## 2023-10-15 MED ORDER — FAMOTIDINE IN NACL 20-0.9 MG/50ML-% IV SOLN
20.0000 mg | Freq: Once | INTRAVENOUS | Status: AC
  Administered 2023-10-15: 20 mg via INTRAVENOUS
  Filled 2023-10-15: qty 50

## 2023-10-15 MED ORDER — SODIUM CHLORIDE 0.9 % IV BOLUS
1000.0000 mL | Freq: Once | INTRAVENOUS | Status: AC
  Administered 2023-10-15: 1000 mL via INTRAVENOUS

## 2023-10-15 MED ORDER — METOCLOPRAMIDE HCL 10 MG PO TABS: 10.0000 mg | ORAL_TABLET | Freq: Four times a day (QID) | ORAL | 0 refills | Status: DC

## 2023-10-15 MED ORDER — DICYCLOMINE HCL 20 MG PO TABS
20.0000 mg | ORAL_TABLET | Freq: Two times a day (BID) | ORAL | 0 refills | Status: DC
Start: 2023-10-15 — End: 2024-03-08

## 2023-10-15 MED ORDER — LIDOCAINE VISCOUS HCL 2 % MT SOLN
15.0000 mL | Freq: Once | OROMUCOSAL | Status: DC
  Filled 2023-10-15: qty 15

## 2023-10-15 MED ORDER — FENTANYL CITRATE PF 50 MCG/ML IJ SOSY
50.0000 ug | PREFILLED_SYRINGE | Freq: Once | INTRAMUSCULAR | Status: AC
  Administered 2023-10-15: 50 ug via INTRAVENOUS
  Filled 2023-10-15: qty 1

## 2023-10-15 MED ORDER — FAMOTIDINE 20 MG PO TABS: 20.0000 mg | ORAL_TABLET | Freq: Every day | ORAL | 0 refills | Status: DC

## 2023-10-15 MED ORDER — IOHEXOL 300 MG/ML  SOLN
100.0000 mL | Freq: Once | INTRAMUSCULAR | Status: AC | PRN
  Administered 2023-10-15: 80 mL via INTRAVENOUS

## 2023-10-15 NOTE — ED Triage Notes (Signed)
 Pt arrived via [facility] from home c/o abd pain, N/V since Friday which she was seen 6/13 at [facility] for same.   EMS VS  BP 132/88 HR 80  SpO2% 98% RA  CBG 108  Temp 99.2

## 2023-10-15 NOTE — Discharge Instructions (Addendum)

## 2023-10-15 NOTE — ED Provider Notes (Signed)
 [place] EMERGENCY DEPARTMENT AT [facility] Provider Note   CSN: [id number] Arrival date & time: 10/15/23  1303     Patient presents with: No chief complaint on file.   Brooke Christian is a 38 y.o. female who presents emergency department chief complaint of epigastric abdominal pain.  She was seen 2 days ago for the same complaint.  Patient reports that she had pain in her tooth and applied Goody's powder to the tooth to make it feel better.  She also took Tylenol  and Advil .  She states that she has not been taking the medications regularly before that and has not had any since.  Since that time she has been having severe pain in her epigastrium nausea.  She denies any active vomiting diarrhea fevers.  Review of previous workup shows that symptoms resolved she did not have any imaging.  She denies alcohol abuse or regular NSAID use.  The pain does not radiate.  She denies fevers or chills.   HPI     Prior to Admission medications   Medication Sig Start Date End Date Taking? Authorizing Provider  albuterol  (VENTOLIN  HFA) 108 (90 Base) MCG/ACT inhaler Inhale 1-2 puffs into the lungs every 4 (four) hours as needed for wheezing or shortness of breath. 07/26/21   Banister, Paige Boatman, MD  celecoxib (CELEBREX) 200 MG capsule Take 200 mg by mouth 2 (two) times daily. 03/07/23   [provider]  hydrOXYzine (VISTARIL) 25 MG capsule Take 25 mg by mouth 3 (three) times daily. 02/07/23   [provider]  ibuprofen  (ADVIL ) 800 MG tablet Take 1 tablet (800 mg total) by mouth every 8 (eight) hours as needed. 06/26/23   Constant, Peggy, MD  levothyroxine  (SYNTHROID ) 50 MCG tablet Take 1 tablet (50 mcg total) by mouth daily. 01/13/22   Marius Siemens, NP  metroNIDAZOLE  (FLAGYL ) 500 MG tablet Take 1 tablet (500 mg total) by mouth 2 (two) times daily. 06/27/23   Dove, Myra C, MD  montelukast  (SINGULAIR ) 10 MG tablet Take 1 tablet (10 mg total) by mouth at bedtime. 07/26/21    Ann Keto, MD  ondansetron  (ZOFRAN -ODT) 4 MG disintegrating tablet Take 1 tablet (4 mg total) by mouth every 8 (eight) hours as needed for nausea or vomiting. 09/13/23   Johny Nap, NP  ondansetron  (ZOFRAN -ODT) 4 MG disintegrating tablet Take 1 tablet (4 mg total) by mouth every 8 (eight) hours as needed for nausea or vomiting. 10/13/23   Burnette Carte, MD  Oxycodone HCl 10 MG TABS Take 10 mg by mouth 4 (four) times daily as needed. 03/07/23   [provider]  SUMAtriptan  (IMITREX ) 100 MG tablet Take 1 tablet (100 mg total) by mouth once as needed for up to 1 dose for migraine. May repeat in 2 hours if headache persists or recurs. 09/13/23   Johny Nap, NP  ZOLMitriptan  (ZOMIG ) 2.5 MG tablet Take 1 tab BID 2 days prior to start of menses, continue for 5 days after (total of 7 days), can use extra dose if needed for rescue therapy 09/13/23   Johny Nap, NP    Allergies: Naproxen     Review of Systems  Updated Vital Signs BP (!) 158/81 (BP Location: Right Arm)   Pulse (!) 106   Temp 98.1 F (36.7 C)   Resp 20   Ht 5' 8 (1.727 m)   Wt 70.8 kg   LMP 09/19/2023 (Approximate)   SpO2 100%   BMI 23.72 kg/m   Physical Exam  Vitals and nursing note reviewed.  Constitutional:      General: She is not in acute distress.    Appearance: She is well-developed. She is not diaphoretic.  HENT:     Head: Normocephalic and atraumatic.     Right Ear: External ear normal.     Left Ear: External ear normal.     Nose: Nose normal.     Mouth/Throat:     Mouth: Mucous membranes are dry.   Eyes:     General: No scleral icterus.    Conjunctiva/sclera: Conjunctivae normal.    Cardiovascular:     Rate and Rhythm: Normal rate and regular rhythm.     Heart sounds: Normal heart sounds. No murmur heard.    No friction rub. No gallop.  Pulmonary:     Effort: Pulmonary effort is normal. No respiratory distress.     Breath sounds: Normal breath sounds.  Abdominal:      General: Bowel sounds are normal. There is no distension.     Palpations: Abdomen is soft. There is no mass.     Tenderness: There is abdominal tenderness in the epigastric area. There is no guarding.   Musculoskeletal:     Cervical back: Normal range of motion.   Skin:    General: Skin is warm and dry.   Neurological:     Mental Status: She is alert and oriented to person, place, and time.   Psychiatric:        Behavior: Behavior normal.     (all labs ordered are listed, but only abnormal results are displayed) Labs Reviewed  CBC  HCG, SERUM, QUALITATIVE  URINALYSIS, ROUTINE W REFLEX MICROSCOPIC  COMPREHENSIVE METABOLIC PANEL WITH GFR  LIPASE, BLOOD  URINE DRUG SCREEN    EKG: None  Radiology: No results found.   Procedures   Medications Ordered in the ED  famotidine (PEPCID) IVPB 20 mg premix (20 mg Intravenous New Bag/Given 10/15/23 1525)  sodium chloride 0.9 % bolus 1,000 mL (1,000 mLs Intravenous New Bag/Given 10/15/23 1524)  ondansetron  (ZOFRAN ) injection 4 mg (4 mg Intravenous Given 10/15/23 1525)  fentaNYL (SUBLIMAZE) injection 50 mcg (50 mcg Intravenous Given 10/15/23 1527)  iohexol (OMNIPAQUE) 300 MG/ML solution 100 mL (80 mLs Intravenous Contrast Given 10/15/23 1525)    Clinical Course as of 10/15/23 1726  Sun Oct 15, 2023  1652 CT ABDOMEN PELVIS W CONTRAST I visualized and interpreted CT abdomen pelvis negative which shows acute findings [AH]  1653 Urine Drug Screen(!) UDS positive for fentanyl which was given here in the emergency department and THC. [AH]    Clinical Course User Index [AH] Tama Fails, PA-C                                 Medical Decision Making Amount and/or Complexity of Data Reviewed Labs: ordered. Decision-making details documented in ED Course. Radiology: ordered. Decision-making details documented in ED Course.  Risk OTC drugs. Prescription drug management.   This patient presents to the ED for concern of epigastric  pain , this involves an extensive number of treatment options, and is a complaint that carries with it a high risk of complications and morbidity.  Differential diagnosis of epigastric pain includes: Functional or nonulcer dyspepsia  PUD, GERD, Gastritis, pancreatitis or pancreatic cancer astroparesis, lactose intolerance, malabsorption gastric cancer,cholelithiasis, choledocholithiasis, or cholangitis, ACS, pericarditis, pneumonia, abdominal hernia, pregnancy, intestinal ischemia, esophageal rupture, gastric volvulus, hepatitis, .   Co morbidities:  has a past medical history of Anemia, Anxiety, Asthma, and Hypothyroidism.   Social Determinants of Health:   SDOH Screenings   Depression (PHQ2-9): Low Risk  (06/26/2023)  Tobacco Use: High Risk (10/13/2023)     Additional history:  {Additional history obtained from emr   Lab Tests:  I Ordered, and personally interpreted labs.  The pertinent results include:   Urine shows contamination with blood during menstrual cycle.  No evidence of infection no bacteriuria UDS positive for THC and fentanyl given here in the emergency department CMP with no significant abnormalities, lipase within normal limits, negative hCG qualitative Imaging Studies:  I ordered imaging studies including CT abdomen and pelvis I independently visualized and interpreted imaging which showed no acute findings I agree with the radiologist interpretation    Medicines ordered and prescription drug management:  I ordered medication including  Medications  alum & mag hydroxide-simeth (MAALOX/MYLANTA) 200-200-20 MG/5ML suspension 30 mL (30 mLs Oral Not Given 10/15/23 1652)    And  lidocaine  (XYLOCAINE ) 2 % viscous mouth solution 15 mL (15 mLs Oral Not Given 10/15/23 1653)  sodium chloride 0.9 % bolus 1,000 mL (1,000 mLs Intravenous New Bag/Given 10/15/23 1524)  famotidine (PEPCID) IVPB 20 mg premix (20 mg Intravenous New Bag/Given 10/15/23 1525)  ondansetron   (ZOFRAN ) injection 4 mg (4 mg Intravenous Given 10/15/23 1525)  fentaNYL (SUBLIMAZE) injection 50 mcg (50 mcg Intravenous Given 10/15/23 1527)  iohexol (OMNIPAQUE) 300 MG/ML solution 100 mL (80 mLs Intravenous Contrast Given 10/15/23 1525)   for epigastric abdominal pain Reevaluation of the patient after these medicines showed that the patient resolved I have reviewed the patients home medicines and have made adjustments as needed  Test Considered:    Critical Interventions:    Consultations Obtained:   Problem List / ED Course:     ICD-10-CM   1. Epigastric abdominal pain  R10.13       MDM: Here with epigastric abdominal pain resolved with treatment here in the emergency department I offered a viscous lidocaine  and Maalox cocktail however t the patient declined the medications.  She otherwise appears to have a gastric cause of her symptoms.  She is tolerating p.o. fluids will discharge with Bentyl, Reglan, and Pepcid.  Discussed outpatient follow-up and return precautions.   Dispostion:  After consideration of the diagnostic results and the patients response to treatment, I feel that the patent would benefit from discharge with symptomatic treatment.      Final diagnoses:  None    ED Discharge Orders     None          Tama Fails, PA-C 10/15/23 1730    Almond Army, MD 10/15/23 2312

## 2023-10-15 NOTE — ED Notes (Signed)
 Transport to CT

## 2023-10-15 NOTE — ED Notes (Signed)
 Dc instructions reviewed with patient. Patient voiced understanding. Dc with belongings.

## 2023-10-31 DIAGNOSIS — Z79899 Other long term (current) drug therapy: Secondary | ICD-10-CM | POA: Diagnosis not present

## 2023-11-07 DIAGNOSIS — Z79899 Other long term (current) drug therapy: Secondary | ICD-10-CM | POA: Diagnosis not present

## 2023-12-05 DIAGNOSIS — Z79899 Other long term (current) drug therapy: Secondary | ICD-10-CM | POA: Diagnosis not present

## 2024-01-05 DIAGNOSIS — Z79899 Other long term (current) drug therapy: Secondary | ICD-10-CM | POA: Diagnosis not present

## 2024-01-12 DIAGNOSIS — Z79899 Other long term (current) drug therapy: Secondary | ICD-10-CM | POA: Diagnosis not present

## 2024-01-31 DIAGNOSIS — Z1159 Encounter for screening for other viral diseases: Secondary | ICD-10-CM | POA: Diagnosis not present

## 2024-01-31 DIAGNOSIS — E559 Vitamin D deficiency, unspecified: Secondary | ICD-10-CM | POA: Diagnosis not present

## 2024-01-31 DIAGNOSIS — R5383 Other fatigue: Secondary | ICD-10-CM | POA: Diagnosis not present

## 2024-01-31 DIAGNOSIS — Z131 Encounter for screening for diabetes mellitus: Secondary | ICD-10-CM | POA: Diagnosis not present

## 2024-01-31 DIAGNOSIS — Z79899 Other long term (current) drug therapy: Secondary | ICD-10-CM | POA: Diagnosis not present

## 2024-01-31 DIAGNOSIS — D539 Nutritional anemia, unspecified: Secondary | ICD-10-CM | POA: Diagnosis not present

## 2024-01-31 DIAGNOSIS — G43909 Migraine, unspecified, not intractable, without status migrainosus: Secondary | ICD-10-CM | POA: Diagnosis not present

## 2024-01-31 DIAGNOSIS — E785 Hyperlipidemia, unspecified: Secondary | ICD-10-CM | POA: Diagnosis not present

## 2024-02-06 DIAGNOSIS — Z79899 Other long term (current) drug therapy: Secondary | ICD-10-CM | POA: Diagnosis not present

## 2024-02-14 ENCOUNTER — Encounter (HOSPITAL_BASED_OUTPATIENT_CLINIC_OR_DEPARTMENT_OTHER): Payer: Self-pay | Admitting: Emergency Medicine

## 2024-02-14 ENCOUNTER — Other Ambulatory Visit: Payer: Self-pay

## 2024-02-14 DIAGNOSIS — Y92410 Unspecified street and highway as the place of occurrence of the external cause: Secondary | ICD-10-CM | POA: Insufficient documentation

## 2024-02-14 DIAGNOSIS — S161XXA Strain of muscle, fascia and tendon at neck level, initial encounter: Secondary | ICD-10-CM | POA: Diagnosis not present

## 2024-02-14 DIAGNOSIS — S0990XA Unspecified injury of head, initial encounter: Secondary | ICD-10-CM | POA: Insufficient documentation

## 2024-02-14 DIAGNOSIS — M542 Cervicalgia: Secondary | ICD-10-CM | POA: Diagnosis present

## 2024-02-14 LAB — PREGNANCY, URINE: Preg Test, Ur: NEGATIVE
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
## 2024-02-15 ENCOUNTER — Emergency Department (HOSPITAL_BASED_OUTPATIENT_CLINIC_OR_DEPARTMENT_OTHER)
Admission: EM | Admit: 2024-02-15 | Discharge: 2024-02-15 | Disposition: A | Discharge status: Home / Self Care | Attending: Emergency Medicine | Admitting: Emergency Medicine

## 2024-02-15 ENCOUNTER — Emergency Department (HOSPITAL_BASED_OUTPATIENT_CLINIC_OR_DEPARTMENT_OTHER)

## 2024-02-15 DIAGNOSIS — S199XXA Unspecified injury of neck, initial encounter: Secondary | ICD-10-CM | POA: Diagnosis not present

## 2024-02-15 DIAGNOSIS — S0990XA Unspecified injury of head, initial encounter: Secondary | ICD-10-CM | POA: Diagnosis not present

## 2024-02-15 DIAGNOSIS — R519 Headache, unspecified: Secondary | ICD-10-CM | POA: Diagnosis not present

## 2024-02-15 DIAGNOSIS — S161XXA Strain of muscle, fascia and tendon at neck level, initial encounter: Secondary | ICD-10-CM

## 2024-02-15 DIAGNOSIS — M542 Cervicalgia: Secondary | ICD-10-CM | POA: Diagnosis not present
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
## 2024-02-15 NOTE — ED Notes (Signed)
 Patient transported to CT

## 2024-02-15 NOTE — Discharge Instructions (Signed)
Take ibuprofen 600 mg every 6 hours as needed for pain.  Rest.  Follow-up with primary doctor if not improving in the next week.

## 2024-03-05 DIAGNOSIS — Z79899 Other long term (current) drug therapy: Secondary | ICD-10-CM | POA: Diagnosis not present

## 2024-03-08 ENCOUNTER — Ambulatory Visit (HOSPITAL_COMMUNITY): Admission: EM | Admit: 2024-03-08 | Discharge: 2024-03-08 | Disposition: A | Discharge status: Home / Self Care

## 2024-03-08 ENCOUNTER — Encounter (HOSPITAL_COMMUNITY): Payer: Self-pay

## 2024-03-08 DIAGNOSIS — S0083XA Contusion of other part of head, initial encounter: Secondary | ICD-10-CM
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
## 2024-03-08 NOTE — Discharge Instructions (Signed)
Return if any problems. Tylenol for discomfort
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
## 2024-04-02 DIAGNOSIS — Z79899 Other long term (current) drug therapy: Secondary | ICD-10-CM | POA: Diagnosis not present

## 2024-09-17 ENCOUNTER — Ambulatory Visit: Admitting: Adult Health
